# Patient Record
Sex: Male | Born: 1980
Health system: Southern US, Community
[De-identification: ages and names within clinical notes are randomized; demographics above are authoritative.]

## PROBLEM LIST (undated history)

## (undated) DIAGNOSIS — E232 Diabetes insipidus: Secondary | ICD-10-CM

## (undated) DIAGNOSIS — E249 Cushing's syndrome, unspecified: Secondary | ICD-10-CM

## (undated) HISTORY — PX: OTHER SURGICAL HISTORY: SHX169

## (undated) HISTORY — DX: Diabetes insipidus: E23.2

---
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
## 2003-09-03 ENCOUNTER — Ambulatory Visit (HOSPITAL_COMMUNITY): Admission: RE | Admit: 2003-09-03 | Discharge: 2003-09-03 | Payer: Self-pay | Admitting: Endocrinology

## 2004-05-19 ENCOUNTER — Ambulatory Visit: Payer: Self-pay | Admitting: Family Medicine

## 2004-07-29 ENCOUNTER — Ambulatory Visit: Payer: Self-pay | Admitting: Family Medicine

## 2013-10-03 HISTORY — PX: OTHER SURGICAL HISTORY: SHX169

## 2014-01-28 ENCOUNTER — Ambulatory Visit (INDEPENDENT_AMBULATORY_CARE_PROVIDER_SITE_OTHER): Payer: BC Managed Care – PPO | Admitting: Cardiovascular Disease

## 2014-01-28 ENCOUNTER — Telehealth: Payer: Self-pay | Admitting: *Deleted

## 2014-01-28 ENCOUNTER — Encounter: Payer: Self-pay | Admitting: *Deleted

## 2014-01-28 ENCOUNTER — Encounter: Payer: Self-pay | Admitting: Cardiovascular Disease

## 2014-01-28 ENCOUNTER — Other Ambulatory Visit: Payer: Self-pay | Admitting: *Deleted

## 2014-01-28 VITALS — BP 132/80 | HR 53 | Ht 69.0 in | Wt 158.0 lb

## 2014-01-28 DIAGNOSIS — Z0000 Encounter for general adult medical examination without abnormal findings: Secondary | ICD-10-CM

## 2014-01-28 DIAGNOSIS — R079 Chest pain, unspecified: Secondary | ICD-10-CM

## 2014-01-28 DIAGNOSIS — Z9889 Other specified postprocedural states: Secondary | ICD-10-CM

## 2014-01-28 DIAGNOSIS — Z8249 Family history of ischemic heart disease and other diseases of the circulatory system: Secondary | ICD-10-CM

## 2014-01-28 DIAGNOSIS — R490 Dysphonia: Secondary | ICD-10-CM

## 2014-01-28 NOTE — Telephone Encounter (Signed)
LEFT   3  VOICE MAILS OVER  THE LAST   3  DAYS  TO FIND  OUT  SOME HISTORY ON PT  HAS NOT  RETURNED ANY CALLS   LEFT MESSAGE FOR  PT  TO  COME  EARLY  TODAY   SO   CAN REVIEW  WITH PT .Benjamin Huynh

## 2014-01-28 NOTE — Patient Instructions (Signed)
Your physician wants you to follow-up in:  YEAR   WITH   DR Haywood Filler will receive a reminder letter in the mail two months in advance. If you don't receive a letter, please call our office to schedule the follow-up appointment. Your physician recommends that you continue on your current medications as directed. Please refer to the Current Medication list given to you today. Your physician has requested that you have an exercise tolerance test. For further information please visit [email or web address] Please also follow instruction sheet, as given.  Your physician recommends that you return for lab work in:  FASTING LIPOMED CA  SCORE   $150.00  OUT OF POCKET

## 2014-01-28 NOTE — Assessment & Plan Note (Signed)
Multiple male family members with MI in 58 90's  Will check lipomed profile.  Also recommended coronary calcium score

## 2014-01-28 NOTE — Assessment & Plan Note (Signed)
Atypical normal ECG  F/U ETT  

## 2014-01-28 NOTE — Progress Notes (Signed)
Patient ID: Benjamin Huynh, male   DOB: 26-Jun-1980, 33 y.o.   MRN: [id number]   33 yo referred for chest pain.  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  Patient has had some unusual medical issues at a young age.  Post pituitary surgery for cushings.  Sees Balin regularly Not on any replacement.  Short period of diabetes insipidus.  Also has had vocal cord papilloma's Recent surgery at [facility] Voice still hoarse.  Since surgery has had some straining to talk with resultant neck and chest pain.  Bad episode a few weeks ago after talking for a prolonged period.  Pain L chest crampy radiating to arm Lasted over an hour.  He does exercise without chest pain in general .  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   .     ROS: Denies fever, malais, weight loss, blurry vision, decreased visual acuity, cough, sputum, SOB, hemoptysis, pleuritic pain, palpitaitons, heartburn, abdominal pain, melena, lower extremity edema, claudication, or rash.  All other systems reviewed and negative   General: Affect appropriate Healthy:  appears stated age HEENT: normal Neck supple with no adenopathy JVP normal no bruits no thyromegaly Lungs clear with no wheezing and good diaphragmatic motion Heart:  S1/S2 no murmur,rub, gallop or click PMI normal Abdomen: benighn, BS positve, no tenderness, no AAA no bruit.  No HSM or HJR Distal pulses intact with no bruits No edema Neuro non-focal Skin warm and dry No muscular weakness  Medications Current Outpatient Prescriptions  Medication Sig Dispense Refill  . Multiple Vitamin (MULTIVITAMIN WITH MINERALS) TABS tablet Take 1 tablet by mouth daily.       No current facility-administered medications for this visit.    Allergies Prilosec  Family History: Family History  Problem Relation Age of Onset  . Melanoma Mother   . Melanoma Father   . Heart attack Father   . Heart disease Father     Social History: History   Social History    . Marital Status: Single    Spouse Name: N/A    Number of Children: N/A  . Years of Education: N/A   Occupational History  . Not on file.   Social History Main Topics  . Smoking status: Former Smoker    Types: Cigars  . Smokeless tobacco: Never Used  . Alcohol Use: Not on file  . Drug Use: Not on file  . Sexual Activity: Not on file   Other Topics Concern  . Not on file   Social History Narrative  . No narrative on file    Electrocardiogram:  SR rate 53 normal   Assessment and Plan

## 2014-01-28 NOTE — Assessment & Plan Note (Signed)
F/U Babtist  Slow to improve no evidence of malignancy per patient

## 2014-01-28 NOTE — Assessment & Plan Note (Signed)
No physical signs of cushings and f/u Baline for labs and surveillance for DI

## 2014-03-02 ENCOUNTER — Other Ambulatory Visit: Payer: BC Managed Care – PPO | Admitting: *Deleted

## 2014-03-02 ENCOUNTER — Ambulatory Visit (INDEPENDENT_AMBULATORY_CARE_PROVIDER_SITE_OTHER): Payer: BC Managed Care – PPO | Admitting: Physician Assistant

## 2014-03-02 ENCOUNTER — Ambulatory Visit (INDEPENDENT_AMBULATORY_CARE_PROVIDER_SITE_OTHER)
Admission: RE | Admit: 2014-03-02 | Discharge: 2014-03-02 | Disposition: A | Discharge status: Home / Self Care | Payer: Self-pay | Source: Ambulatory Visit | Attending: Cardiovascular Disease | Admitting: Cardiovascular Disease

## 2014-03-02 DIAGNOSIS — R079 Chest pain, unspecified: Secondary | ICD-10-CM

## 2014-03-02 DIAGNOSIS — Z0000 Encounter for general adult medical examination without abnormal findings: Secondary | ICD-10-CM

## 2014-03-02 NOTE — Addendum Note (Signed)
Addended by: Tonita Phoenix on: 03/02/2014 09:59 AM   Modules accepted: Orders

## 2014-03-02 NOTE — Progress Notes (Deleted)
Exercise Treadmill Test  Pre-Exercise Testing Evaluation Rhythm: sinus bradycardia  Rate: 52     Test  Exercise Tolerance Test Ordering MD: Charlton Haws, MD  Interpreting MD: Tereso Newcomer, PA-C  Unique Test No: 1  Treadmill:  1  Indication for ETT: chest pain - rule out ischemia  Contraindication to ETT: No   Stress Modality: exercise - treadmill  Cardiac Imaging Performed: non   Protocol: standard Bruce - maximal  Max BP:  176/83  Max MPHR (bpm):  190 85% MPR (bpm):  162  MPHR obtained (bpm):  171 % MPHR obtained:  90  Reached 85% MPHR (min:sec):  12:36 Total Exercise Time (min-sec):  14:00  Workload in METS:  17.2 Borg Scale: 13  Reason ETT Terminated:  desired heart rate attained    ST Segment Analysis At Rest: normal ST segments - no evidence of significant ST depression With Exercise: non-specific ST changes  Other Information Arrhythmia:  No Angina during ETT:  absent (0) Quality of ETT:  diagnostic  ETT Interpretation:  normal - no evidence of ischemia by ST analysis  Comments: Excellent exercise capacity. No chest pain. Normal BP response to exercise. Insignificant up-sloping ST depression. No changes to suggest ischemia.   Recommendations: FU with Dr. Charlton Haws as planned. Signed,  Tereso Newcomer, PA-C   03/02/2014 9:52 AM

## 2014-03-02 NOTE — Progress Notes (Signed)
Exercise Treadmill Test  Pre-Exercise Testing Evaluation Rhythm: sinus bradycardia  Rate: 52     Test  Exercise Tolerance Test Ordering MD: Peter Nishan, MD  Interpreting MD: Abdoulaye Drum, PA-C  Unique Test No: 1  Treadmill:  1  Indication for ETT: chest pain - rule out ischemia  Contraindication to ETT: No   Stress Modality: exercise - treadmill  Cardiac Imaging Performed: non   Protocol: standard Bruce - maximal  Max BP:  176/83  Max MPHR (bpm):  190 85% MPR (bpm):  162  MPHR obtained (bpm):  171 % MPHR obtained:  90  Reached 85% MPHR (min:sec):  12:36 Total Exercise Time (min-sec):  14:00  Workload in METS:  17.2 Borg Scale: 13  Reason ETT Terminated:  desired heart rate attained    ST Segment Analysis At Rest: normal ST segments - no evidence of significant ST depression With Exercise: non-specific ST changes  Other Information Arrhythmia:  No Angina during ETT:  absent (0) Quality of ETT:  diagnostic  ETT Interpretation:  normal - no evidence of ischemia by ST analysis  Comments: Excellent exercise capacity. No chest pain. Normal BP response to exercise. Insignificant up-sloping ST depression. No changes to suggest ischemia.   Recommendations: FU with Dr. Peter Nishan as planned. Signed,  Eniyah Eastmond, PA-C   03/02/2014 9:52 AM     

## 2014-03-04 LAB — NMR LIPOPROFILE WITH LIPIDS
Cholesterol, Total: 250 mg/dL — ABNORMAL HIGH (ref 100–199)
HDL PARTICLE NUMBER: 33.1 umol/L (ref >=30.5)
HDL Size: 9 nm — ABNORMAL LOW (ref >=9.2)
HDL-C: 56 mg/dL (ref >39)
LDL CALC: 175 mg/dL — AB (ref 0–99)
LDL PARTICLE NUMBER: 1868 nmol/L — AB (ref <1000)
LDL Size: 20.7 nm (ref >=20.8)
LP-IR SCORE: 39 (ref <=45)
Large HDL-P: 6.7 umol/L (ref >=4.8)
Large VLDL-P: 1.8 nmol/L (ref <=2.7)
Small LDL Particle Number: 641 nmol/L — ABNORMAL HIGH (ref <=527)
Triglycerides: 94 mg/dL (ref 0–149)
VLDL Size: 38.4 nm (ref <=46.6)

## 2014-03-05 NOTE — Progress Notes (Signed)
PT AWARE OF  CA SCORE  AND  GXT  RESULTS

## 2014-06-01 ENCOUNTER — Other Ambulatory Visit: Payer: Self-pay | Admitting: Urology

## 2014-06-01 DIAGNOSIS — N5082 Scrotal pain: Secondary | ICD-10-CM

## 2014-06-02 ENCOUNTER — Ambulatory Visit
Admission: RE | Admit: 2014-06-02 | Discharge: 2014-06-02 | Disposition: A | Discharge status: Home / Self Care | Payer: BC Managed Care – PPO | Source: Ambulatory Visit | Attending: Urology | Admitting: Urology

## 2014-06-02 DIAGNOSIS — N5082 Scrotal pain: Secondary | ICD-10-CM

## 2015-09-01 ENCOUNTER — Emergency Department (HOSPITAL_COMMUNITY)
Admission: EM | Admit: 2015-09-01 | Discharge: 2015-09-01 | Disposition: A | Discharge status: Home / Self Care | Payer: BLUE CROSS/BLUE SHIELD | Attending: Emergency Medicine | Admitting: Emergency Medicine

## 2015-09-01 ENCOUNTER — Encounter (HOSPITAL_COMMUNITY): Payer: Self-pay | Admitting: Emergency Medicine

## 2015-09-01 DIAGNOSIS — R197 Diarrhea, unspecified: Secondary | ICD-10-CM | POA: Insufficient documentation

## 2015-09-01 DIAGNOSIS — Z87891 Personal history of nicotine dependence: Secondary | ICD-10-CM | POA: Diagnosis not present

## 2015-09-01 DIAGNOSIS — R112 Nausea with vomiting, unspecified: Secondary | ICD-10-CM | POA: Diagnosis not present

## 2015-09-01 DIAGNOSIS — E860 Dehydration: Secondary | ICD-10-CM | POA: Insufficient documentation

## 2015-09-01 DIAGNOSIS — R1033 Periumbilical pain: Secondary | ICD-10-CM | POA: Diagnosis not present

## 2015-09-01 DIAGNOSIS — R509 Fever, unspecified: Secondary | ICD-10-CM | POA: Insufficient documentation

## 2015-09-01 DIAGNOSIS — M791 Myalgia: Secondary | ICD-10-CM | POA: Insufficient documentation

## 2015-09-01 LAB — COMPREHENSIVE METABOLIC PANEL
ALBUMIN: 5.1 g/dL — AB (ref 3.5–5.0)
ALK PHOS: 80 U/L (ref 38–126)
ALT: 23 U/L (ref 17–63)
AST: 26 U/L (ref 15–41)
Anion gap: 16 — ABNORMAL HIGH (ref 5–15)
BUN: 14 mg/dL (ref 6–20)
CALCIUM: 10.5 mg/dL — AB (ref 8.9–10.3)
CO2: 19 mmol/L — AB (ref 22–32)
Chloride: 106 mmol/L (ref 101–111)
Creatinine, Ser: 1.45 mg/dL — ABNORMAL HIGH (ref 0.61–1.24)
GFR calc Af Amer: >60 mL/min (ref >60)
GFR calc non Af Amer: >60 mL/min (ref >60)
GLUCOSE: 129 mg/dL — AB (ref 65–99)
Potassium: 4 mmol/L (ref 3.5–5.1)
SODIUM: 141 mmol/L (ref 135–145)
Total Bilirubin: 1.2 mg/dL (ref 0.3–1.2)
Total Protein: 7.9 g/dL (ref 6.5–8.1)

## 2015-09-01 LAB — I-STAT CHEM 8, ED
BUN: 19 mg/dL (ref 6–20)
Calcium, Ion: 1.13 mmol/L (ref 1.12–1.23)
Chloride: 109 mmol/L (ref 101–111)
Creatinine, Ser: 1.1 mg/dL (ref 0.61–1.24)
GLUCOSE: 110 mg/dL — AB (ref 65–99)
HCT: 50 % (ref 39.0–52.0)
HEMOGLOBIN: 17 g/dL (ref 13.0–17.0)
POTASSIUM: 4.8 mmol/L (ref 3.5–5.1)
SODIUM: 141 mmol/L (ref 135–145)
TCO2: 20 mmol/L (ref 0–100)

## 2015-09-01 LAB — CBC
HCT: 51.3 % (ref 39.0–52.0)
Hemoglobin: 18.3 g/dL — ABNORMAL HIGH (ref 13.0–17.0)
MCH: 30.7 pg (ref 26.0–34.0)
MCHC: 35.7 g/dL (ref 30.0–36.0)
MCV: 86.1 fL (ref 78.0–100.0)
Platelets: 266 10*3/uL (ref 150–400)
RBC: 5.96 MIL/uL — ABNORMAL HIGH (ref 4.22–5.81)
RDW: 12.4 % (ref 11.5–15.5)
WBC: 12.4 10*3/uL — ABNORMAL HIGH (ref 4.0–10.5)

## 2015-09-01 LAB — URINE MICROSCOPIC-ADD ON

## 2015-09-01 LAB — URINALYSIS, ROUTINE W REFLEX MICROSCOPIC
Glucose, UA: NEGATIVE mg/dL
HGB URINE DIPSTICK: NEGATIVE
KETONES UR: 15 mg/dL — AB
Nitrite: POSITIVE — AB
PROTEIN: 100 mg/dL — AB
Specific Gravity, Urine: 1.029 (ref 1.005–1.030)
pH: 5 (ref 5.0–8.0)

## 2015-09-01 LAB — I-STAT CG4 LACTIC ACID, ED
Lactic Acid, Venous: 3.53 mmol/L (ref 0.5–2.0)
Lactic Acid, Venous: 1.28 mmol/L (ref 0.5–2.0)

## 2015-09-01 LAB — I-STAT TROPONIN, ED: Troponin i, poc: 0 ng/mL (ref 0.00–0.08)

## 2015-09-01 LAB — LIPASE, BLOOD: Lipase: 23 U/L (ref 11–51)

## 2015-09-01 LAB — MAGNESIUM: Magnesium: 2.2 mg/dL (ref 1.7–2.4)

## 2015-09-01 LAB — CBG MONITORING, ED: Glucose-Capillary: 118 mg/dL — ABNORMAL HIGH (ref 65–99)

## 2015-09-01 MED ORDER — SODIUM CHLORIDE 0.9 % IV BOLUS (SEPSIS)
1000.0000 mL | Freq: Once | INTRAVENOUS | Status: AC
  Administered 2015-09-01: 1000 mL via INTRAVENOUS

## 2015-09-01 MED ORDER — ONDANSETRON 4 MG PO TBDP: ORAL_TABLET | ORAL | Status: DC

## 2015-09-01 MED ORDER — ONDANSETRON HCL 4 MG/2ML IJ SOLN
4.0000 mg | Freq: Once | INTRAMUSCULAR | Status: AC
  Administered 2015-09-01: 4 mg via INTRAVENOUS
  Filled 2015-09-01: qty 2

## 2015-09-01 MED ORDER — FENTANYL CITRATE (PF) 100 MCG/2ML IJ SOLN
50.0000 ug | Freq: Once | INTRAMUSCULAR | Status: DC
  Filled 2015-09-01: qty 2

## 2015-09-01 NOTE — ED Notes (Signed)
Temp unattainable in triage. Patient was eating ice cubes on arrival.

## 2015-09-01 NOTE — ED Notes (Signed)
Pt requesting to attempt fluids and food PO; pt given ginger ale and saltine crackers at this time

## 2015-09-01 NOTE — ED Notes (Signed)
Pt ambulatory to restroom with steady gait.

## 2015-09-01 NOTE — ED Provider Notes (Signed)
CSN: [id number]     Arrival date & time 09/01/15  0234 History   By signing my name below, I, Budd PalmerVanessa Prueter, attest that this documentation has been prepared under the direction and in the presence of Devoria AlbeIva Kaidan Harpster, MD at 0344 AM .  Electronically Signed: Budd PalmerVanessa Prueter, ED Scribe. 09/01/2015. 3:56 AM.      Chief Complaint  Patient presents with  . Emesis  . Diarrhea   The history is provided by the patient and the spouse. No language interpreter was used.   HPI Comments: Benjamin Huynh is a 35 y.o. male former smoker with a PMHx of diabetes insipidus who presents to the Emergency Department complaining of emesis (25 episodes) and watery diarrhea about the same onset 9 hours ago. Pt states that the last few vomiting episodes had bloody "chunks" in them. He reports associated periumbilical pain, nausea, subjective fever, generalized myalgias, and dark urine. He notes he called his endocrinologist Dr Lurene ShadowBallen, who recommended pt be seen in the ED. He is not on any medications, though he used to take DDAVP in the past. He notes Dr Lurene ShadowBallen recommended he take medication, but his other physicians did not see this as necessary. He states he has not taken any medication for his condition for over 6 years. He reports drinking occasionally. He does not recall whether he was ever given steroids for his diabetes insipidis. Per wife, pt's daughter is recovering from a stomach virus with similar symptoms. She notes pt had brain surgery for Cushing syndrome when the diabetes insipidus was discovered.   PCP none Endocrinology Dr Lurene ShadowBallen  Past Medical History  Diagnosis Date  . Diabetes insipidus (HCC)     HX OF   Past Surgical History  Procedure Laterality Date  . Vocal chord   spots  removed   10-2013  . Pitituary  sx   1998  AND  2005    CUSHING  SYNDROME   Family History  Problem Relation Age of Onset  . Melanoma Mother   . Melanoma Father   . Heart attack Father   . Heart disease Father    Social  History  Substance Use Topics  . Smoking status: Former Smoker    Types: Cigars  . Smokeless tobacco: Never Used  . Alcohol Use: None  employed Lives with spouse  Review of Systems  Constitutional: Positive for fever (subjective).  Gastrointestinal: Positive for nausea, vomiting, abdominal pain and diarrhea.  Musculoskeletal: Positive for myalgias.  All other systems reviewed and are negative.   Allergies  Prilosec  Home Medications   Prior to Admission medications   Medication Sig Start Date End Date Taking? Authorizing Provider  ondansetron (ZOFRAN ODT) 4 MG disintegrating tablet Dissolve on tongue 1 or 2 q8h prn nausea or vomiting 09/01/15   Devoria AlbeIva Mico Spark, MD   BP 134/94 mmHg  Pulse 124  Resp 20  Ht 5\' 9"  (1.753 m)  Wt 165 lb (74.844 kg)  BMI 24.36 kg/m2  SpO2 99%  Vital signs normal except for tachycardia  Physical Exam  Constitutional: He is oriented to person, place, and time. He appears well-developed and well-nourished.  Non-toxic appearance. He does not appear ill. He appears distressed.  Pt appears uncomfortable and is constantly moving his legs, he was laying on his abdomen with his head hanging off the foot of the stretcher when I first entered the room.   HENT:  Head: Normocephalic and atraumatic.  Right Ear: External ear normal.  Left Ear: External ear normal.  Nose: Nose normal. No mucosal edema or rhinorrhea.  Mouth/Throat: Mucous membranes are normal. No dental abscesses or uvula swelling.  Dry mucous membranes  Eyes: Conjunctivae and EOM are normal. Pupils are equal, round, and reactive to light.  Neck: Normal range of motion and full passive range of motion without pain. Neck supple.  Cardiovascular: Normal rate, regular rhythm and normal heart sounds.  Exam reveals no gallop and no friction rub.   No murmur heard. Pulmonary/Chest: Effort normal and breath sounds normal. No respiratory distress. He has no wheezes. He has no rhonchi. He has no rales. He  exhibits no tenderness and no crepitus.  Abdominal: Soft. Normal appearance and bowel sounds are normal. He exhibits no distension. There is tenderness (periumbilical). There is no rebound and no guarding.  Musculoskeletal: Normal range of motion. He exhibits no edema or tenderness.  Moves all extremities well.   Neurological: He is alert and oriented to person, place, and time. He has normal strength. No cranial nerve deficit.  Skin: Skin is warm, dry and intact. No rash noted. No erythema. No pallor.  Psychiatric: He has a normal mood and affect. His speech is normal and behavior is normal. His mood appears not anxious.  Nursing note and vitals reviewed.   ED Course  Procedures   Medications  fentaNYL (SUBLIMAZE) injection 50 mcg (0 mcg Intravenous Hold 09/01/15 0402)  sodium chloride 0.9 % bolus 1,000 mL (1,000 mLs Intravenous New Bag/Given 09/01/15 0711)  sodium chloride 0.9 % bolus 1,000 mL (0 mLs Intravenous Stopped 09/01/15 0708)  ondansetron (ZOFRAN) injection 4 mg (4 mg Intravenous Given 09/01/15 0402)  sodium chloride 0.9 % bolus 1,000 mL (0 mLs Intravenous Stopped 09/01/15 0708)  sodium chloride 0.9 % bolus 1,000 mL (1,000 mLs Intravenous New Bag/Given 09/01/15 0712)  ondansetron (ZOFRAN) injection 4 mg (4 mg Intravenous Given 09/01/15 0554)    DIAGNOSTIC STUDIES: Oxygen Saturation is 99% on RA, normal by my interpretation.    COORDINATION OF CARE: 3:51 AM - Discussed plans to order IV fluids and anti-nausea medication. Pt advised of plan for treatment and pt agrees.   05:30 AM recheck patient is now sitting on the bed in their correct position and having good eye contact and will obviously looks like he feels better. He states he still having some nausea. He has not had urinary output yet. His test results. Due to his metabolic acidosis and lactic acidosis repeat blood work will be done.  Recheck at 7 AM she has finished his first 2 L IV fluids, he has had minimal dark urine  output. He is going to get 2 more liters. Patient is feeling much improved. He looks much improved and is smiling.  Results for orders placed or performed during the hospital encounter of 09/01/15  Lipase, blood  Result Value Ref Range   Lipase 23 11 - 51 U/L  Comprehensive metabolic panel  Result Value Ref Range   Sodium 141 135 - 145 mmol/L   Potassium 4.0 3.5 - 5.1 mmol/L   Chloride 106 101 - 111 mmol/L   CO2 19 (L) 22 - 32 mmol/L   Glucose, Bld 129 (H) 65 - 99 mg/dL   BUN 14 6 - 20 mg/dL   Creatinine, Ser 1.61 (H) 0.61 - 1.24 mg/dL   Calcium 09.6 (H) 8.9 - 10.3 mg/dL   Total Protein 7.9 6.5 - 8.1 g/dL   Albumin 5.1 (H) 3.5 - 5.0 g/dL   AST 26 15 - 41 U/L   ALT 23  17 - 63 U/L   Alkaline Phosphatase 80 38 - 126 U/L   Total Bilirubin 1.2 0.3 - 1.2 mg/dL   GFR calc non Af Amer >60 >60 mL/min   GFR calc Af Amer >60 >60 mL/min   Anion gap 16 (H) 5 - 15  CBC  Result Value Ref Range   WBC 12.4 (H) 4.0 - 10.5 K/uL   RBC 5.96 (H) 4.22 - 5.81 MIL/uL   Hemoglobin 18.3 (H) 13.0 - 17.0 g/dL   HCT 16.1 09.6 - 04.5 %   MCV 86.1 78.0 - 100.0 fL   MCH 30.7 26.0 - 34.0 pg   MCHC 35.7 30.0 - 36.0 g/dL   RDW 40.9 81.1 - 91.4 %   Platelets 266 150 - 400 K/uL  Urinalysis, Routine w reflex microscopic (not at [facility])  Result Value Ref Range   Color, Urine ORANGE (A) YELLOW   APPearance TURBID (A) CLEAR   Specific Gravity, Urine 1.029 1.005 - 1.030   pH 5.0 5.0 - 8.0   Glucose, UA NEGATIVE NEGATIVE mg/dL   Hgb urine dipstick NEGATIVE NEGATIVE   Bilirubin Urine LARGE (A) NEGATIVE   Ketones, ur 15 (A) NEGATIVE mg/dL   Protein, ur 782 (A) NEGATIVE mg/dL   Nitrite POSITIVE (A) NEGATIVE   Leukocytes, UA SMALL (A) NEGATIVE  Urine microscopic-add on  Result Value Ref Range   Squamous Epithelial / LPF 0-5 (A) NONE SEEN   WBC, UA 6-30 0 - 5 WBC/hpf   RBC / HPF 0-5 0 - 5 RBC/hpf   Bacteria, UA MANY (A) NONE SEEN   Casts HYALINE CASTS (A) NEGATIVE   Crystals CA OXALATE CRYSTALS (A) NEGATIVE   Magnesium  Result Value Ref Range   Magnesium 2.2 1.7 - 2.4 mg/dL  CBG monitoring, ED  Result Value Ref Range   Glucose-Capillary 118 (H) 65 - 99 mg/dL  I-Stat CG4 Lactic Acid, ED  Result Value Ref Range   Lactic Acid, Venous 3.53 (HH) 0.5 - 2.0 mmol/L   Comment NOTIFIED PHYSICIAN   I-stat Chem 8, ED  Result Value Ref Range   Sodium 141 135 - 145 mmol/L   Potassium 4.8 3.5 - 5.1 mmol/L   Chloride 109 101 - 111 mmol/L   BUN 19 6 - 20 mg/dL   Creatinine, Ser 9.56 0.61 - 1.24 mg/dL   Glucose, Bld 213 (H) 65 - 99 mg/dL   Calcium, Ion 0.86 5.78 - 1.23 mmol/L   TCO2 20 0 - 100 mmol/L   Hemoglobin 17.0 13.0 - 17.0 g/dL   HCT 46.9 62.9 - 52.8 %  I-Stat CG4 Lactic Acid, ED  Result Value Ref Range   Lactic Acid, Venous 1.28 0.5 - 2.0 mmol/L   Laboratory interpretation all normal except concentrated hemoglobin consistent with dehydration, leukocytosis, lactic acidosis (I was not notified it was elevated), urinalysis suggestive of possible elevated LFTs with elevated bilirubin however his LFTs are normal. Mild renal insufficiency consistent with dehydration. Repeat lactic acidosis and repeat i-STAT 8 shows the metabolic acidosis has improved from an anion gap of 16 to normal at 12       MDM   Final diagnoses:  Nausea vomiting and diarrhea  Dehydration    New Prescriptions   ONDANSETRON (ZOFRAN ODT) 4 MG DISINTEGRATING TABLET    Dissolve on tongue 1 or 2 q8h prn nausea or vomiting    Plan discharge  Devoria Albe, MD, FACEP   I personally performed the services described in this documentation, which was scribed in my  presence. The recorded information has been reviewed and considered.  Devoria Albe, MD, Concha Pyo, MD 09/01/15 [phone]

## 2015-09-01 NOTE — ED Notes (Signed)
Patient arrives with complaint of vomiting and diarrhea since 2100 last night. Daughter recently with stomach virus and similar symptoms. History of diabetes insipidus. Lethargic vocalizing loudly in triage.

## 2015-09-01 NOTE — ED Notes (Signed)
Pt reports being able to keep fluids down at this time

## 2015-09-01 NOTE — Discharge Instructions (Signed)
Drink plenty of fluids (clear liquids) this morning then start the bland diet such as crackers, toast, jello, [employer] chicken noodle soup . Use the zofran for nausea or vomiting. Take imodium OTC for diarrhea. Avoid mild products until the diarrhea is gone. Avoid fried, spicy, or greasy foods for the next week. Recheck if you get worse again. Dehydration, Adult Dehydration is a condition in which you do not have enough fluid or water in your body. It happens when you take in less fluid than you lose. Vital organs such as the kidneys, brain, and heart cannot function without a proper amount of fluids. Any loss of fluids from the body can cause dehydration.  Dehydration can range from mild to severe. This condition should be treated right away to help prevent it from becoming severe. CAUSES  This condition may be caused by:  Vomiting.  Diarrhea.  Excessive sweating, such as when exercising in hot or humid weather.  Not drinking enough fluid during strenuous exercise or during an illness.  Excessive urine output.  Fever.  Certain medicines. RISK FACTORS This condition is more likely to develop in:  People who are taking certain medicines that cause the body to lose excess fluid (diuretics).   People who have a chronic illness, such as diabetes, that may increase urination.  Older adults.   People who live at high altitudes.   People who participate in endurance sports.  SYMPTOMS  Mild Dehydration  Thirst.  Dry lips.  Slightly dry mouth.  Dry, warm skin. Moderate Dehydration  Very dry mouth.   Muscle cramps.   Dark urine and decreased urine production.   Decreased tear production.   Headache.   Light-headedness, especially when you stand up from a sitting position.  Severe Dehydration  Changes in skin.   Cold and clammy skin.   Skin does not spring back quickly when lightly pinched and released.   Changes in body fluids.   Extreme thirst.    No tears.   Not able to sweat when body temperature is high, such as in hot weather.   Minimal urine production.   Changes in vital signs.   Rapid, weak pulse (more than 100 beats per minute when you are sitting still).   Rapid breathing.   Low blood pressure.   Other changes.   Sunken eyes.   Cold hands and feet.   Confusion.  Lethargy and difficulty being awakened.  Fainting (syncope).   Short-term weight loss.   Unconsciousness. DIAGNOSIS  This condition may be diagnosed based on your symptoms. You may also have tests to determine how severe your dehydration is. These tests may include:   Urine tests.   Blood tests.  TREATMENT  Treatment for this condition depends on the severity. Mild or moderate dehydration can often be treated at home. Treatment should be started right away. Do not wait until dehydration becomes severe. Severe dehydration needs to be treated at the hospital. Treatment for Mild Dehydration  Drinking plenty of water to replace the fluid you have lost.   Replacing minerals in your blood (electrolytes) that you may have lost.  Treatment for Moderate Dehydration  Consuming oral rehydration solution (ORS). Treatment for Severe Dehydration  Receiving fluid through an IV tube.   Receiving electrolyte solution through a feeding tube that is passed through your nose and into your stomach (nasogastric tube or NG tube).  Correcting any abnormalities in electrolytes. HOME CARE INSTRUCTIONS   Drink enough fluid to keep your urine clear or pale  yellow.   Drink water or fluid slowly by taking small sips. You can also try sucking on ice cubes.  Have food or beverages that contain electrolytes. Examples include bananas and sports drinks.  Take over-the-counter and prescription medicines only as told by your health care provider.   Prepare ORS according to the manufacturer's instructions. Take sips of ORS every 5 minutes  until your urine returns to normal.  If you have vomiting or diarrhea, continue to try to drink water, ORS, or both.   If you have diarrhea, avoid:   Beverages that contain caffeine.   Fruit juice.   Milk.   Carbonated soft drinks.  Do not take salt tablets. This can lead to the condition of having too much sodium in your body (hypernatremia).  SEEK MEDICAL CARE IF:  You cannot eat or drink without vomiting.  You have had moderate diarrhea during a period of more than 24 hours.  You have a fever. SEEK IMMEDIATE MEDICAL CARE IF:   You have extreme thirst.  You have severe diarrhea.  You have not urinated in 6-8 hours, or you have urinated only a small amount of very dark urine.  You have shriveled skin.  You are dizzy, confused, or both.   This information is not intended to replace advice given to you by your health care provider. Make sure you discuss any questions you have with your health care provider.   Document Released: 05/22/2005 Document Revised: 02/10/2015 Document Reviewed: 10/07/2014 Elsevier Interactive Patient Education 2016 Else[employer] Inc.  Diarrhea Diarrhea is frequent loose and watery bowel movements. It can cause you to feel weak and dehydrated. Dehydration can cause you to become tired and thirsty, have a dry mouth, and have decreased urination that often is dark yellow. Diarrhea is a sign of another problem, most often an infection that will not last long. In most cases, diarrhea typically lasts 2-3 days. However, it can last longer if it is a sign of something more serious. It is important to treat your diarrhea as directed by your caregiver to lessen or prevent future episodes of diarrhea. CAUSES  Some common causes include:  Gastrointestinal infections caused by viruses, bacteria, or parasites.  Food poisoning or food allergies.  Certain medicines, such as antibiotics, chemotherapy, and laxatives.  Artificial sweeteners and  fructose.  Digestive disorders. HOME CARE INSTRUCTIONS  Ensure adequate fluid intake (hydration): Have 1 cup (8 oz) of fluid for each diarrhea episode. Avoid fluids that contain simple sugars or sports drinks, fruit juices, whole milk products, and sodas. Your urine should be clear or pale yellow if you are drinking enough fluids. Hydrate with an oral rehydration solution that you can purchase at pharmacies, retail stores, and online. You can prepare an oral rehydration solution at home by mixing the following ingredients together:   - tsp table salt.   tsp baking soda.   tsp salt substitute containing potassium chloride.  1  tablespoons sugar.  1 L (34 oz) of water.  Certain foods and beverages may increase the speed at which food moves through the gastrointestinal (GI) tract. These foods and beverages should be avoided and include:  Caffeinated and alcoholic beverages.  High-fiber foods, such as raw fruits and vegetables, nuts, seeds, and whole grain breads and cereals.  Foods and beverages sweetened with sugar alcohols, such as xylitol, sorbitol, and mannitol.  Some foods may be well tolerated and may help thicken stool including:  Starchy foods, such as rice, toast, pasta, low-sugar cereal, oatmeal, grits,  baked potatoes, crackers, and bagels.  Bananas.  Applesauce.  Add probiotic-rich foods to help increase healthy bacteria in the GI tract, such as yogurt and fermented milk products.  Wash your hands well after each diarrhea episode.  Only take over-the-counter or prescription medicines as directed by your caregiver.  Take a warm bath to relieve any burning or pain from frequent diarrhea episodes. SEEK IMMEDIATE MEDICAL CARE IF:   You are unable to keep fluids down.  You have persistent vomiting.  You have blood in your stool, or your stools are black and tarry.  You do not urinate in 6-8 hours, or there is only a small amount of very dark urine.  You have  abdominal pain that increases or localizes.  You have weakness, dizziness, confusion, or light-headedness.  You have a severe headache.  Your diarrhea gets worse or does not get better.  You have a fever or persistent symptoms for more than 2-3 days.  You have a fever and your symptoms suddenly get worse. MAKE SURE YOU:   Understand these instructions.  Will watch your condition.  Will get help right away if you are not doing well or get worse.   This information is not intended to replace advice given to you by your health care provider. Make sure you discuss any questions you have with your health care provider.   Document Released: 05/12/2002 Document Revised: 06/12/2014 Document Reviewed: 01/28/2012 Elsevier Interactive Patient Education 2016 Elsevier Inc.  Nausea and Vomiting Nausea is a sick feeling that often comes before throwing up (vomiting). Vomiting is a reflex where stomach contents come out of your mouth. Vomiting can cause severe loss of body fluids (dehydration). Children and elderly adults can become dehydrated quickly, especially if they also have diarrhea. Nausea and vomiting are symptoms of a condition or disease. It is important to find the cause of your symptoms. CAUSES   Direct irritation of the stomach lining. This irritation can result from increased acid production (gastroesophageal reflux disease), infection, food poisoning, taking certain medicines (such as nonsteroidal anti-inflammatory drugs), alcohol use, or tobacco use.  Signals from the brain.These signals could be caused by a headache, heat exposure, an inner ear disturbance, increased pressure in the brain from injury, infection, a tumor, or a concussion, pain, emotional stimulus, or metabolic problems.  An obstruction in the gastrointestinal tract (bowel obstruction).  Illnesses such as diabetes, hepatitis, gallbladder problems, appendicitis, kidney problems, cancer, sepsis, atypical symptoms of  a heart attack, or eating disorders.  Medical treatments such as chemotherapy and radiation.  Receiving medicine that makes you sleep (general anesthetic) during surgery. DIAGNOSIS Your caregiver may ask for tests to be done if the problems do not improve after a few days. Tests may also be done if symptoms are severe or if the reason for the nausea and vomiting is not clear. Tests may include:  Urine tests.  Blood tests.  Stool tests.  Cultures (to look for evidence of infection).  X-rays or other imaging studies. Test results can help your caregiver make decisions about treatment or the need for additional tests. TREATMENT You need to stay well hydrated. Drink frequently but in small amounts.You may wish to drink water, sports drinks, clear broth, or eat frozen ice pops or gelatin dessert to help stay hydrated.When you eat, eating slowly may help prevent nausea.There are also some antinausea medicines that may help prevent nausea. HOME CARE INSTRUCTIONS   Take all medicine as directed by your caregiver.  If you do not  have an appetite, do not force yourself to eat. However, you must continue to drink fluids.  If you have an appetite, eat a normal diet unless your caregiver tells you differently.  Eat a variety of complex carbohydrates (rice, wheat, potatoes, bread), lean meats, yogurt, fruits, and vegetables.  Avoid high-fat foods because they are more difficult to digest.  Drink enough water and fluids to keep your urine clear or pale yellow.  If you are dehydrated, ask your caregiver for specific rehydration instructions. Signs of dehydration may include:  Severe thirst.  Dry lips and mouth.  Dizziness.  Dark urine.  Decreasing urine frequency and amount.  Confusion.  Rapid breathing or pulse. SEEK IMMEDIATE MEDICAL CARE IF:   You have blood or brown flecks (like coffee grounds) in your vomit.  You have black or bloody stools.  You have a severe headache  or stiff neck.  You are confused.  You have severe abdominal pain.  You have chest pain or trouble breathing.  You do not urinate at least once every 8 hours.  You develop cold or clammy skin.  You continue to vomit for longer than 24 to 48 hours.  You have a fever. MAKE SURE YOU:   Understand these instructions.  Will watch your condition.  Will get help right away if you are not doing well or get worse.   This information is not intended to replace advice given to you by your health care provider. Make sure you discuss any questions you have with your health care provider.   Document Released: 05/22/2005 Document Revised: 08/14/2011 Document Reviewed: 10/19/2010 Elsevier Interactive Patient Education [employer] Elsevier Inc. .

## 2017-12-01 ENCOUNTER — Emergency Department (HOSPITAL_COMMUNITY)
Admission: EM | Admit: 2017-12-01 | Discharge: 2017-12-01 | Disposition: A | Discharge status: Home / Self Care | Payer: BLUE CROSS/BLUE SHIELD | Attending: Emergency Medicine | Admitting: Emergency Medicine

## 2017-12-01 ENCOUNTER — Other Ambulatory Visit: Payer: Self-pay

## 2017-12-01 ENCOUNTER — Encounter (HOSPITAL_COMMUNITY): Payer: Self-pay

## 2017-12-01 DIAGNOSIS — W57XXXA Bitten or stung by nonvenomous insect and other nonvenomous arthropods, initial encounter: Secondary | ICD-10-CM

## 2017-12-01 DIAGNOSIS — F17290 Nicotine dependence, other tobacco product, uncomplicated: Secondary | ICD-10-CM | POA: Insufficient documentation

## 2017-12-01 DIAGNOSIS — T63441A Toxic effect of venom of bees, accidental (unintentional), initial encounter: Secondary | ICD-10-CM | POA: Diagnosis present

## 2017-12-01 HISTORY — DX: Cushing's syndrome, unspecified: E24.9

## 2017-12-01 MED ORDER — METHYLPREDNISOLONE SODIUM SUCC 125 MG IJ SOLR
125.0000 mg | Freq: Once | INTRAMUSCULAR | Status: AC
  Administered 2017-12-01: 125 mg via INTRAVENOUS
  Filled 2017-12-01: qty 2

## 2017-12-01 MED ORDER — DIPHENHYDRAMINE HCL 50 MG/ML IJ SOLN
25.0000 mg | Freq: Once | INTRAMUSCULAR | Status: AC
  Administered 2017-12-01: 25 mg via INTRAVENOUS
  Filled 2017-12-01: qty 1

## 2017-12-01 MED ORDER — EPINEPHRINE 0.3 MG/0.3ML IJ SOAJ: 0.3000 mg | Freq: Once | INTRAMUSCULAR | 0 refills | Status: AC

## 2017-12-01 MED ORDER — FAMOTIDINE IN NACL 20-0.9 MG/50ML-% IV SOLN
20.0000 mg | Freq: Once | INTRAVENOUS | Status: AC
  Administered 2017-12-01: 20 mg via INTRAVENOUS
  Filled 2017-12-01: qty 50

## 2017-12-01 MED ORDER — SODIUM CHLORIDE 0.9 % IV BOLUS
1000.0000 mL | Freq: Once | INTRAVENOUS | Status: AC
  Administered 2017-12-01: 1000 mL via INTRAVENOUS

## 2017-12-01 MED ORDER — PREDNISONE 10 MG (21) PO TBPK: ORAL_TABLET | Freq: Every day | ORAL | 0 refills | Status: DC

## 2017-12-01 NOTE — ED Triage Notes (Addendum)
Pt stung by "like 10 bees"; pt endorses allergic reaction in the past, but "it wasn't like this"  Pt sob, shaking; pt states "my face was numb, my lips were numb; I couldn't move my hands or feet"  Pt took 2 benadryl pta  VSS

## 2017-12-01 NOTE — ED Provider Notes (Signed)
[facility] EMERGENCY DEPARTMENT Provider Note   CSN: [id number] Arrival date & time: 12/01/17  1739     History   Chief Complaint Chief Complaint  Patient presents with  . Allergic Reaction    HPI [facility] Benjamin Huynh. is a 37 y.o. male here for evaluation of possible allergic reaction.  Patient was outside and was stung by approximately 10 bees.  He took 2 Benadryl PTA.  Initially had shortness of breath, this has resolved.  Currently is endorsing tingling all over his body including his face, feels like he cannot move his hands or feet.  Has local swelling to the areas where he was stung but denies rash.  He has no headache, nausea, vomiting, chest pain, shortness of breath, abdominal pain, diarrhea. Onset was sudden, improving.   HPI  Past Medical History:  Diagnosis Date  . Cushings syndrome (HCC)     There are no active problems to display for this patient.   ** The histories are not reviewed yet. Please review them in the "History" navigator section and refresh this SmartLink.      Home Medications    Prior to Admission medications   Medication Sig Start Date End Date Taking? Authorizing Provider  diphenhydrAMINE (BENADRYL) 25 mg capsule Take 50 mg by mouth every 6 (six) hours as needed for allergies.   Yes [provider]  EPINEPHrine 0.3 mg/0.3 mL IJ SOAJ injection Inject 0.3 mLs (0.3 mg total) into the muscle once for 1 dose. 12/01/17 12/01/17  Liberty Handy, PA-C  predniSONE (STERAPRED UNI-PAK 21 TAB) 10 MG (21) TBPK tablet Take by mouth daily. Take 3 tabs by mouth daily  for 2 days, then 2 tabs for 2 days, then 1 tabs for 2 days, then 1/5 tab for 2 days. 12/01/17   Liberty Handy, PA-C    Family History No family history on file.  Social History Social History   Tobacco Use  . Smoking status: Light Tobacco Smoker    Types: Cigars  Substance Use Topics  . Alcohol use: Yes    Alcohol/week: 6.0 oz    Types: 10 Cans of  beer per week  . Drug use: Not Currently     Allergies   Bee venom and Omeprazole   Review of Systems Review of Systems  Respiratory: Positive for shortness of breath (resolved).   Neurological:       Paresthesias   All other systems reviewed and are negative.    Physical Exam Updated Vital Signs BP 116/74   Pulse (!) 53   Resp 10   SpO2 95%   Physical Exam  Constitutional: He is oriented to person, place, and time. He appears well-developed and well-nourished. No distress.  Non toxic. Looks anxious.   HENT:  Head: Normocephalic and atraumatic.  Nose: Nose normal.  No facial, tongue, oropharyngeal, lip edema. Oropharynx and tonsils easily visualized. Uvula midline without edema. MMM. No trismus. No stridor.   Eyes: Pupils are equal, round, and reactive to light. Conjunctivae and EOM are normal.  Neck: Normal range of motion.  Cardiovascular: Normal rate, regular rhythm, normal heart sounds and intact distal pulses.  2+ DP and radial pulses bilaterally. No LE asymmetric edema.   Pulmonary/Chest: Effort normal and breath sounds normal.  No wheezing. Normal WOB. Speaking in full sentences.   Abdominal: Soft. Bowel sounds are normal. There is no tenderness.  No G/R/R. No suprapubic or CVA tenderness. Negative Murphy's and McBurney's  Musculoskeletal: Normal range of  motion.  Neurological: He is alert and oriented to person, place, and time.  Skin: Skin is warm and dry. Capillary refill takes less than 2 seconds.  One urticarial lesion to epigastrium approx 1x1 cm. No diffuse urticarial rash to face, thorax, upper or lower extremities.   Psychiatric: He has a normal mood and affect. His behavior is normal. Judgment and thought content normal.  Nursing note and vitals reviewed.    ED Treatments / Results  Labs (all labs ordered are listed, but only abnormal results are displayed) Labs Reviewed - No data to display  EKG None  Radiology No results  found.  Procedures Procedures (including critical care time)  Medications Ordered in ED Medications  sodium chloride 0.9 % bolus 1,000 mL (1,000 mLs Intravenous New Bag/Given 12/01/17 1819)  diphenhydrAMINE (BENADRYL) injection 25 mg (25 mg Intravenous Given 12/01/17 1825)  methylPREDNISolone sodium succinate (SOLU-MEDROL) 125 mg/2 mL injection 125 mg (125 mg Intravenous Given 12/01/17 1820)  famotidine (PEPCID) IVPB 20 mg premix (0 mg Intravenous Stopped 12/01/17 1901)     Initial Impression / Assessment and Plan / ED Course  I have reviewed the triage vital signs and the nursing notes.  Pertinent labs & imaging results that were available during my care of the patient were reviewed by me and considered in my medical decision making (see chart for details).  Clinical Course as of Dec 02 1938  Sat Dec 01, 2017  1930 Reevaluated patient.  He reports complete resolution of symptoms.  Vital signs within normal limits and stable.  Discussed plan to discharge and patient and family agreeable.   [CG]    Clinical Course User Index [CG] Liberty HandyGibbons, Zoua Caporaso J, PA-C   37 year old here after possible allergic reaction.  He was stung by multiple bees.  Initially, patient appears very anxious but overall nontoxic.  No tachycardia.  No signs of facial angioedema, wheezing.  He has no urticarial rash.  Minimal local inflammation to bee stings noted.  By the time he arrived to the ER his initial shortness of breath had resolved.  No signs of anaphylactic reaction.  Patient was given IV fluids, Pepcid, Benadryl, Solu-Medrol and his symptoms completely resolved.  Tolerating p.o.  Will discharge with short prednisone taper and EpiPen.  Discussed indications for EpiPen use.  Discussed return precautions.  Patient and wife are in agreement.  Final Clinical Impressions(s) / ED Diagnoses   Final diagnoses:  Insect bite of multiple sites with local reaction    ED Discharge Orders        Ordered    EPINEPHrine  0.3 mg/0.3 mL IJ SOAJ injection   Once     12/01/17 1931    predniSONE (STERAPRED UNI-PAK 21 TAB) 10 MG (21) TBPK tablet  Daily     12/01/17 1939       Jerrell MylarGibbons, Kein Carlberg J, PA-C 12/01/17 1940    Raeford RazorKohut, Stephen, MD 12/01/17 2355

## 2017-12-01 NOTE — Discharge Instructions (Addendum)
You were seen in the emergency department after a reaction to bee stings.  By the time you were in the ER your symptoms had started to improve.  You were given steroids, pepcid, Benadryl and fluids to help with the reaction.  Your symptoms completely resolved.   Typical, mild, allergic reactions include rash, itching, skin flushing, local inflammation.  More severe reactions include chest pain, shortness of breath, facial tongue or lip swelling, difficulty swallowing saliva, abdominal pain, vomiting diarrhea. Return if you develop symptoms of severe reaction. Use epipen for severe reactions.   Complete steroid taper, this helps prevent delayed allergic reaction or rebound allergic reaction.

## 2017-12-03 ENCOUNTER — Encounter (HOSPITAL_COMMUNITY): Payer: Self-pay | Admitting: Emergency Medicine

## 2018-03-04 ENCOUNTER — Ambulatory Visit: Payer: BLUE CROSS/BLUE SHIELD | Admitting: Neurology

## 2018-04-22 ENCOUNTER — Telehealth: Payer: Self-pay | Admitting: Neurology

## 2018-04-22 ENCOUNTER — Ambulatory Visit (INDEPENDENT_AMBULATORY_CARE_PROVIDER_SITE_OTHER): Payer: BLUE CROSS/BLUE SHIELD | Admitting: Neurology

## 2018-04-22 ENCOUNTER — Encounter: Payer: Self-pay | Admitting: Neurology

## 2018-04-22 VITALS — BP 125/77 | HR 56 | Ht 68.0 in | Wt 165.0 lb

## 2018-04-22 DIAGNOSIS — M5412 Radiculopathy, cervical region: Secondary | ICD-10-CM

## 2018-04-22 DIAGNOSIS — R29898 Other symptoms and signs involving the musculoskeletal system: Secondary | ICD-10-CM | POA: Diagnosis not present

## 2018-04-22 DIAGNOSIS — M4712 Other spondylosis with myelopathy, cervical region: Secondary | ICD-10-CM | POA: Diagnosis not present

## 2018-04-22 DIAGNOSIS — R200 Anesthesia of skin: Secondary | ICD-10-CM

## 2018-04-22 NOTE — Progress Notes (Signed)
[facility]    Provider:  Dr Lucia Gaskins Referring Provider: [name], DC Primary Care Physician:   [name], DC  CC:  Arm numbness  HPI:  [facility] Montez Hageman. is a 37 y.o. male here as requested by Dr. Manson Passey for neck and back pain.  Patient has a past medical history of pituitary adenoma, diabetes insipidus, Cushing syndrome in 1998 treated with surgery.  Other past medical history noted from review of chart includes in 2015 seen at [facility]: Laryngeal pharyngeal reflux, dysphonia, lesion of vocal cord, laryngeal papilloma, anterior laryngeal web. The spine feels tingly paraspinals left side (points to C7). Ongoing for  Months. He had numbness of both arms, from the shoulders to the hands, positional when he layed down, would happen more when working out. Recently he was digging in his yard and symptoms lasted 6 months, symptoms would be continuous and he had a spot in his back that felt tingly near his spine, position mad his hands tingle. Worse with laying down, he would wake up with the pain. His DO said he saw degenerative changes in the spine around the are of where it hurts. He also had mild weakness in the arms. Bilateral. No other associated symptoms such as dysphagia, dysarthria, vision changes but his legs felt numb too. He went to chiro for 6 months and conservative treatment. When he was young would notice sensations in the hands, numbness that would resolve.  Reviewed notes, labs and imaging from outside physicians, which showed:  Reviewed notes from [name] patient has been diagnosed with segmental and somatic dysfunctional of cervical region, cervical radiculopathy, segmental and somatic dysfunction of lumbar region.  His chief complaint is numbness and tingling in the hands onset 2014.  The patient is well-nourished and well-developed and in no acute distress.  The patient complained of neck pain with radiation into the arms bilaterally in the C6  dermatomes.  Radiculopathy is worse in the a.m. and often awakens him.  Also present after long drives.  Patient reports pain in the cervical spine.  A dull ache radiates along the paraspinal musculature from C2-C7.  Sharp ache tends to radiate into the trapezius muscles and the base of the skull.  Rotation of both right and left lateral flexion causes a sharp stabbing sensation.  Flexion and extension causes stiffness and a soreness that is occasionally sharp in nature.  Any sudden movement causes musculature increase in symptoms.  He also reports pain in the lumbar spine a dull ache radiates along the paraspinal musculature bilaterally from L2-S1.  Sharp ache tends to radiate into the sacroiliac joint.  Rotation in both right and left lateral flexion causes a sharp stabbing sensation.  Flexion and extension causes a stiffness and a soreness that is occasionally sharp in nature.  Any sudden movement causes an increase in symptoms.  He has been treated in chiropractic care twice weekly over 4 weeks and notices slight improvement after his adjustment but his symptoms returned soon after.  He was referred here for neurologic consultation..  Reviewed report of MRI in 2005.  Patient with Cushing's disease possible pituitary adenoma.  Patient had a history of pituitary adenoma in 1998 treated with surgery.  MRI of the brain and sella with and without contrast.  Normal MRI of the brain.  Apparent 4 mm hypoenhancing focus within the right side of the pituitary gland this could represent a pituitary microadenoma.  Review of Systems: Patient complains of symptoms per HPI as well  as the following symptoms: numbness. Pertinent negatives and positives per HPI. All others negative.   Social History   Socioeconomic History  . Marital status: Married    Spouse name: Not on file  . Number of children: 2  . Years of education: Not on file  . Highest education level: Master's degree (e.g., MA, MS, MEng, MEd, MSW, MBA)    Occupational History  . Not on file  Social Needs  . Financial resource strain: Not on file  . Food insecurity:    Worry: Not on file    Inability: Not on file  . Transportation needs:    Medical: Not on file    Non-medical: Not on file  Tobacco Use  . Smoking status: Light Tobacco Smoker    Types: Cigars  . Smokeless tobacco: Never Used  . Tobacco comment: very rare  Substance and Sexual Activity  . Alcohol use: Yes    Alcohol/week: 10.0 standard drinks    Types: 10 Cans of beer per week  . Drug use: Not Currently  . Sexual activity: Not on file  Lifestyle  . Physical activity:    Days per week: Not on file    Minutes per session: Not on file  . Stress: Not on file  Relationships  . Social connections:    Talks on phone: Not on file    Gets together: Not on file    Attends religious service: Not on file    Active member of club or organization: Not on file    Attends meetings of clubs or organizations: Not on file    Relationship status: Not on file  . Intimate partner violence:    Fear of current or ex partner: Not on file    Emotionally abused: Not on file    Physically abused: Not on file    Forced sexual activity: Not on file  Other Topics Concern  . Not on file  Social History Narrative   Lives at home with wife & kids   Right handed   Caffeine: about 4 cups daily         ** Merged History Encounter **        Family History  Problem Relation Age of Onset  . Melanoma Mother   . CAD Mother        stents  . Hodgkin's lymphoma Mother   . Melanoma Father   . Heart attack Father   . Heart disease Father   . Other Sister        meningitis  . Heart attack Paternal Grandmother   . Heart attack Paternal Grandfather   . Breast cancer Other        multiple family members    Past Medical History:  Diagnosis Date  . Cushings syndrome (HCC)   . Diabetes insipidus (HCC)    HX OF    Past Surgical History:  Procedure Laterality Date  . PITITUARY  SX    1998  AND  2005   CUSHING  SYNDROME  . polyp removal    . VOCAL CHORD   SPOTS  REMOVED   10-2013    No current outpatient medications on file.   No current facility-administered medications for this visit.     Allergies as of 04/22/2018 - Review Complete 04/22/2018  Allergen Reaction Noted  . Bee venom Anaphylaxis 12/01/2017  . Omeprazole Other (See Comments) 12/01/2017  . Prilosec [omeprazole] Other (See Comments) 01/28/2014    Vitals: BP 125/77 (BP Location: Left  Arm, Patient Position: Sitting)   Pulse (!) 56   Ht 5\' 8"  (1.727 m)   Wt 165 lb (74.8 kg)   BMI 25.09 kg/m  Last Weight:  Wt Readings from Last 1 Encounters:  04/22/18 165 lb (74.8 kg)   Last Height:   Ht Readings from Last 1 Encounters:  04/22/18 5\' 8"  (1.727 m)   Physical exam: Exam: Gen: NAD, conversant, well nourised,  well groomed                     CV: RRR, no MRG. No Carotid Bruits. No peripheral edema, warm, nontender Eyes: Conjunctivae clear without exudates or hemorrhage  Neuro: Detailed Neurologic Exam  Speech:    Speech is normal; fluent and spontaneous with normal comprehension.  Cognition:    The patient is oriented to person, place, and time;     recent and remote memory intact;     language fluent;     normal attention, concentration,     fund of knowledge Cranial Nerves:    The pupils are equal, round, and reactive to light. The fundi are normal and spontaneous venous pulsations are present. Visual fields are full to finger confrontation. Extraocular movements are intact. Trigeminal sensation is intact and the muscles of mastication are normal. The face is symmetric. The palate elevates in the midline. Hearing intact. Voice is normal. Shoulder shrug is normal. The tongue has normal motion without fasciculations.   Coordination:    Normal finger to nose and heel to shin. Normal rapid alternating movements.   Gait:    Heel-toe and tandem gait are normal.   Motor Observation:    No  asymmetry, no atrophy, and no involuntary movements noted. Tone:    Normal muscle tone.    Posture:    Posture is normal. normal erect    Strength: weaker hand grip left as compared ot the right.otherwise strength is V/V in the upper and lower limbs.      Sensation: intact to LT     Reflex Exam:  DTR's:    Deep tendon reflexes in the upper and lower extremities are brisker on the left bilaterally.   Toes:    The toes are equiv bilaterally.   Clonus:    Clonus is absent.       Assessment/Plan:  [age] year old with numbness ad tingling in both arms and in the legs, point tenderness of the spine and degenerative changes on XRay at the area of his pain, focal findings of brisk asymmetric reflexes on the left side, >6 months of conservative treatment and symptoms continue. Need MRI cervical spine to evaluate for cevical lesion/myelopathy given focal point tendernes c7/c8 and focal neurologic findings, numbness of both arms and sometimes legs.  Emg/ncs bilateral uppers MRI cervical spine  If no significant findings consider MRI of the brain  Orders Placed This Encounter  Procedures  . MR CERVICAL SPINE WO CONTRAST  . NCV with EMG(electromyography)    Cc:  Alan MulderBrown, Rodney, DC   Naomie DeanAntonia Alphonse Asbridge, MD  [facility] [address] [place], [place] [id number]  Phone [phone] Fax [phone]

## 2018-04-22 NOTE — Telephone Encounter (Signed)
lvm for pt to call back about scheduling mri  [employer] Auth: [id number] (exp. 04/22/18 to 05/21/18)

## 2018-04-22 NOTE — Telephone Encounter (Signed)
Patient returned my call [facility]

## 2018-04-22 NOTE — Patient Instructions (Signed)
MRI cervical spine Emg/ncs  Electromyoneurogram Electromyoneurogram is a test to check how well your muscles and nerves are working. This procedure includes the combined use of electromyogram (EMG) and nerve conduction study (NCS). EMG is used to look for muscular disorders. NCS, which is also called electroneurogram, measures how well your nerves are controlling your muscles. The procedures are usually performed together to check if your muscles and nerves are healthy. If the reaction to testing is abnormal, this can indicate disease or injury, such as peripheral nerve damage. Tell a health care provider about:  Any allergies you have.  All medicines you are taking, including vitamins, herbs, eye drops, creams, and over-the-counter medicines.  Any problems you or family members have had with anesthetic medicines.  Any blood disorders you have.  Any surgeries you have had.  Any medical conditions you have.  Any pacemaker you have. What are the risks? Generally, this is a safe procedure. However, problems may occur, including:  Infection where the electrodes were inserted.  Bleeding.  What happens before the procedure?  Ask your health care provider about: ? Changing or stopping your regular medicines. This is especially important if you are taking diabetes medicines or blood thinners. ? Taking medicines such as aspirin and ibuprofen. These medicines can thin your blood. Do not take these medicines before your procedure if your health care provider instructs you not to.  Your health care provider may ask you to avoid: ? Caffeine, such as coffee and tea. ? Nicotine. This includes cigarettes and anything with tobacco.  Do not use lotions or creams on the same day that you will be having the procedure. What happens during the procedure? For EMG:  Your health care provider will ask you to stay in a position so that he or she can access the muscle that will be studied. You may be  standing, sitting down, or lying down.  You may be given a medicine that numbs the area (local anesthetic).  A very thin needle that has an electrode on it will be inserted into your muscle.  Another small electrode will be placed on your skin near the muscle.  Your health care provider will ask you to continue to remain still.  The electrodes will send a signal that tells about the electrical activity of your muscles. You may see this on a monitor or hear it in the room.  After your muscles have been studied at rest, your health care provider will ask you to contract or flex your muscles. The electrodes will send a signal that tells about the electrical activity of your muscles.  Your health care provider will remove the electrodes and the electrode needles when the procedure is finished. The procedure may vary among health care providers and hospitals. For NCS:  An electrode that records your nerve activity (recording electrode) will be placed on your skin by the muscle that is being studied.  An electrode that is used as a reference (reference electrode) will be placed near the recording electrode.  A paste or gel will be applied to your skin between the recording electrode and the reference electrode.  Your nerve will be stimulated with a mild shock. Your health care provider will measure how much time it takes for your muscle to react.  Your health care provider will remove the electrodes and the gel when the procedure is finished. The procedure may vary among health care providers and hospitals. What happens after the procedure?  It is your  responsibility to obtain your test results. Ask your health care provider or the department performing the test when and how you will get your results.  Your health care provider may: ? Give you medicines for any pain. ? Monitor the insertion sites to make sure that they stop bleeding. This information is not intended to replace advice  given to you by your health care provider. Make sure you discuss any questions you have with your health care provider. Document Released: 09/22/2004 Document Revised: 10/28/2015 Document Reviewed: 07/13/2014 Elsevier Interactive Patient Education  2018 [employer].

## 2018-04-30 ENCOUNTER — Ambulatory Visit: Payer: BLUE CROSS/BLUE SHIELD

## 2018-04-30 DIAGNOSIS — M5412 Radiculopathy, cervical region: Secondary | ICD-10-CM | POA: Diagnosis not present

## 2018-04-30 DIAGNOSIS — R29898 Other symptoms and signs involving the musculoskeletal system: Secondary | ICD-10-CM | POA: Diagnosis not present

## 2018-04-30 DIAGNOSIS — R200 Anesthesia of skin: Secondary | ICD-10-CM

## 2018-04-30 DIAGNOSIS — G959 Disease of spinal cord, unspecified: Secondary | ICD-10-CM

## 2018-04-30 DIAGNOSIS — M4712 Other spondylosis with myelopathy, cervical region: Secondary | ICD-10-CM | POA: Diagnosis not present

## 2018-05-09 ENCOUNTER — Telehealth: Payer: Self-pay | Admitting: *Deleted

## 2018-05-09 NOTE — Telephone Encounter (Signed)
-----   Message from Anson FretAntonia B Ahern, MD sent at 05/01/2018  8:45 AM EST ----- This MRI of the cervical spine shows minimal degenerative changes at C3-C4 and C4-C5 that do not lead to foraminal narrowing or nerve root compression.  Other levels are normal.  The spinal cord appears normal. Does not appear his symptoms are due to problems in the cervical spine. We will see him for emg/ncs thanks

## 2018-05-09 NOTE — Telephone Encounter (Signed)
Called pt & LVM (ok per DPR) informing pt that MRI of his cervical spine shows minimal degenerative changes at C3-C4 and C4-C5 that do not lead to foraminal narrowing or nerve root compression.  Other levels are normal.  The spinal cord appears normal. Does not appear his symptoms are due to problems in the cervical spine. We will see him for emg/ncs on 05/23/18. Left office number and hours in case pt has any questions prior to EMG.

## 2018-05-23 ENCOUNTER — Encounter: Payer: BLUE CROSS/BLUE SHIELD | Admitting: Neurology

## 2019-03-20 ENCOUNTER — Other Ambulatory Visit: Payer: Self-pay

## 2019-03-20 DIAGNOSIS — Z20822 Contact with and (suspected) exposure to covid-19: Secondary | ICD-10-CM

## 2019-03-21 LAB — NOVEL CORONAVIRUS, NAA: SARS-CoV-2, NAA: NOT DETECTED

## 2019-03-31 ENCOUNTER — Other Ambulatory Visit: Payer: Self-pay

## 2019-03-31 DIAGNOSIS — Z20822 Contact with and (suspected) exposure to covid-19: Secondary | ICD-10-CM

## 2019-04-01 LAB — NOVEL CORONAVIRUS, NAA: SARS-CoV-2, NAA: NOT DETECTED

## 2019-05-22 DIAGNOSIS — M5411 Radiculopathy, occipito-atlanto-axial region: Secondary | ICD-10-CM | POA: Diagnosis not present

## 2019-05-22 DIAGNOSIS — M9903 Segmental and somatic dysfunction of lumbar region: Secondary | ICD-10-CM | POA: Diagnosis not present

## 2019-05-22 DIAGNOSIS — M9901 Segmental and somatic dysfunction of cervical region: Secondary | ICD-10-CM | POA: Diagnosis not present

## 2019-06-09 DIAGNOSIS — M9901 Segmental and somatic dysfunction of cervical region: Secondary | ICD-10-CM | POA: Diagnosis not present

## 2019-06-09 DIAGNOSIS — M9903 Segmental and somatic dysfunction of lumbar region: Secondary | ICD-10-CM | POA: Diagnosis not present

## 2019-06-09 DIAGNOSIS — M5411 Radiculopathy, occipito-atlanto-axial region: Secondary | ICD-10-CM | POA: Diagnosis not present

## 2019-06-23 DIAGNOSIS — M9901 Segmental and somatic dysfunction of cervical region: Secondary | ICD-10-CM | POA: Diagnosis not present

## 2019-06-23 DIAGNOSIS — M5411 Radiculopathy, occipito-atlanto-axial region: Secondary | ICD-10-CM | POA: Diagnosis not present

## 2019-06-23 DIAGNOSIS — M9903 Segmental and somatic dysfunction of lumbar region: Secondary | ICD-10-CM | POA: Diagnosis not present

## 2019-06-26 ENCOUNTER — Other Ambulatory Visit: Payer: Self-pay

## 2019-06-26 ENCOUNTER — Ambulatory Visit (INDEPENDENT_AMBULATORY_CARE_PROVIDER_SITE_OTHER): Payer: BC Managed Care – PPO | Admitting: Neurology

## 2019-06-26 DIAGNOSIS — R292 Abnormal reflex: Secondary | ICD-10-CM | POA: Diagnosis not present

## 2019-06-26 DIAGNOSIS — R29818 Other symptoms and signs involving the nervous system: Secondary | ICD-10-CM

## 2019-06-26 DIAGNOSIS — G5603 Carpal tunnel syndrome, bilateral upper limbs: Secondary | ICD-10-CM | POA: Diagnosis not present

## 2019-06-26 DIAGNOSIS — R200 Anesthesia of skin: Secondary | ICD-10-CM

## 2019-06-26 DIAGNOSIS — R202 Paresthesia of skin: Secondary | ICD-10-CM

## 2019-06-26 DIAGNOSIS — Z0289 Encounter for other administrative examinations: Secondary | ICD-10-CM

## 2019-06-26 NOTE — Progress Notes (Signed)
See procedure note.

## 2019-06-26 NOTE — Patient Instructions (Addendum)
MRI brain   Carpal Tunnel Syndrome  Carpal tunnel syndrome is a condition that causes pain in your hand and arm. The carpal tunnel is a narrow area located on the palm side of your wrist. Repeated wrist motion or certain diseases may cause swelling within the tunnel. This swelling pinches the main nerve in the wrist (median nerve). What are the causes? This condition may be caused by:  Repeated wrist motions.  Wrist injuries.  Arthritis.  A cyst or tumor in the carpal tunnel.  Fluid buildup during pregnancy. Sometimes the cause of this condition is not known. What increases the risk? The following factors may make you more likely to develop this condition:  Having a job, such as being a Haematologist, that requires you to repeatedly move your wrist in the same motion.  Being a woman.  Having certain conditions, such as: ? Diabetes. ? Obesity. ? An underactive thyroid (hypothyroidism). ? Kidney failure. What are the signs or symptoms? Symptoms of this condition include:  A tingling feeling in your fingers, especially in your thumb, index, and middle fingers.  Tingling or numbness in your hand.  An aching feeling in your entire arm, especially when your wrist and elbow are bent for a long time.  Wrist pain that goes up your arm to your shoulder.  Pain that goes down into your palm or fingers.  A weak feeling in your hands. You may have trouble grabbing and holding items. Your symptoms may feel worse during the night. How is this diagnosed? This condition is diagnosed with a medical history and physical exam. You may also have tests, including:  Electromyogram (EMG). This test measures electrical signals sent by your nerves into the muscles.  Nerve conduction study. This test measures how well electrical signals pass through your nerves.  Imaging tests, such as X-rays, ultrasound, and MRI. These tests check for possible causes of your condition. How is this  treated? This condition may be treated with:  Lifestyle changes. It is important to stop or change the activity that caused your condition.  Doing exercise and activities to strengthen your muscles and bones (physical therapy).  Learning how to use your hand again after diagnosis (occupational therapy).  Medicines for pain and inflammation. This may include medicine that is injected into your wrist.  A wrist splint.  Surgery. Follow these instructions at home: If you have a splint:  Wear the splint as told by your health care provider. Remove it only as told by your health care provider.  Loosen the splint if your fingers tingle, become numb, or turn cold and blue.  Keep the splint clean.  If the splint is not waterproof: ? Do not let it get wet. ? Cover it with a watertight covering when you take a bath or shower. Managing pain, stiffness, and swelling   If directed, put ice on the painful area: ? If you have a removable splint, remove it as told by your health care provider. ? Put ice in a plastic bag. ? Place a towel between your skin and the bag. ? Leave the ice on for 20 minutes, 2-3 times per day. General instructions  Take over-the-counter and prescription medicines only as told by your health care provider.  Rest your wrist from any activity that may be causing your pain. If your condition is work related, talk with your employer about changes that can be made, such as getting a wrist pad to use while typing.  Do  any exercises as told by your health care provider, physical therapist, or occupational therapist.  Keep all follow-up visits as told by your health care provider. This is important. Contact a health care provider if:  You have new symptoms.  Your pain is not controlled with medicines.  Your symptoms get worse. Get help right away if:  You have severe numbness or tingling in your wrist or hand. Summary  Carpal tunnel syndrome is a condition that  causes pain in your hand and arm.  It is usually caused by repeated wrist motions.  Lifestyle changes and medicines are used to treat carpal tunnel syndrome. Surgery may be recommended.  Follow your health care provider's instructions about wearing a splint, resting from activity, keeping follow-up visits, and calling for help. This information is not intended to replace advice given to you by your health care provider. Make sure you discuss any questions you have with your health care provider. Document Revised: 09/28/2017 Document Reviewed: 09/28/2017 Elsevier Patient Education  [employer].    Peripheral Neuropathy Peripheral neuropathy is a type of nerve damage. It affects nerves that carry signals between the spinal cord and the arms, legs, and the rest of the body (peripheral nerves). It does not affect nerves in the spinal cord or brain. In peripheral neuropathy, one nerve or a group of nerves may be damaged. Peripheral neuropathy is a broad category that includes many specific nerve disorders, like diabetic neuropathy, hereditary neuropathy, and carpal tunnel syndrome. What are the causes? This condition may be caused by:  Diabetes. This is the most common cause of peripheral neuropathy.  Nerve injury.  Pressure or stress on a nerve that lasts a long time.  Lack (deficiency) of B vitamins. This can result from alcoholism, poor diet, or a restricted diet.  Infections.  Autoimmune diseases, such as rheumatoid arthritis and systemic lupus erythematosus.  Nerve diseases that are passed from parent to child (inherited).  Some medicines, such as cancer medicines (chemotherapy).  Poisonous (toxic) substances, such as lead and mercury.  Too little blood flowing to the legs.  Kidney disease.  Thyroid disease. In some cases, the cause of this condition is not known. What are the signs or symptoms? Symptoms of this condition depend on which of your nerves is damaged.  Common symptoms include:  Loss of feeling (numbness) in the feet, hands, or both.  Tingling in the feet, hands, or both.  Burning pain.  Very sensitive skin.  Weakness.  Not being able to move a part of the body (paralysis).  Muscle twitching.  Clumsiness or poor coordination.  Loss of balance.  Not being able to control your bladder.  Feeling dizzy.  Sexual problems. How is this diagnosed? Diagnosing and finding the cause of peripheral neuropathy can be difficult. Your health care provider will take your medical history and do a physical exam. A neurological exam will also be done. This involves checking things that are affected by your brain, spinal cord, and nerves (nervous system). For example, your health care provider will check your reflexes, how you move, and what you can feel. You may have other tests, such as:  Blood tests.  Electromyogram (EMG) and nerve conduction tests. These tests check nerve function and how well the nerves are controlling the muscles.  Imaging tests, such as CT scans or MRI to rule out other causes of your symptoms.  Removing a small piece of nerve to be examined in a lab (nerve biopsy). This is rare.  Removing  and examining a small amount of the fluid that surrounds the brain and spinal cord (lumbar puncture). This is rare. How is this treated? Treatment for this condition may involve:  Treating the underlying cause of the neuropathy, such as diabetes, kidney disease, or vitamin deficiencies.  Stopping medicines that can cause neuropathy, such as chemotherapy.  Medicine to relieve pain. Medicines may include: ? Prescription or over-the-counter pain medicine. ? Antiseizure medicine. ? Antidepressants. ? Pain-relieving patches that are applied to painful areas of skin.  Surgery to relieve pressure on a nerve or to destroy a nerve that is causing pain.  Physical therapy to help improve movement and balance.  Devices to help you  move around (assistive devices). Follow these instructions at home: Medicines  Take over-the-counter and prescription medicines only as told by your health care provider. Do not take any other medicines without first asking your health care provider.  Do not drive or use heavy machinery while taking prescription pain medicine. Lifestyle   Do not use any products that contain nicotine or tobacco, such as cigarettes and e-cigarettes. Smoking keeps blood from reaching damaged nerves. If you need help quitting, ask your health care provider.  Avoid or limit alcohol. Too much alcohol can cause a vitamin B deficiency, and vitamin B is needed for healthy nerves.  Eat a healthy diet. This includes: ? Eating foods that are high in fiber, such as fresh fruits and vegetables, whole grains, and beans. ? Limiting foods that are high in fat and processed sugars, such as fried or sweet foods. General instructions   If you have diabetes, work closely with your health care provider to keep your blood sugar under control.  If you have numbness in your feet: ? Check every day for signs of injury or infection. Watch for redness, warmth, and swelling. ? Wear padded socks and comfortable shoes. These help protect your feet.  Develop a good support system. Living with peripheral neuropathy can be stressful. Consider talking with a mental health specialist or joining a support group.  Use assistive devices and attend physical therapy as told by your health care provider. This may include using a walker or a cane.  Keep all follow-up visits as told by your health care provider. This is important. Contact a health care provider if:  You have new signs or symptoms of peripheral neuropathy.  You are struggling emotionally from dealing with peripheral neuropathy.  Your pain is not well-controlled. Get help right away if:  You have an injury or infection that is not healing normally.  You develop new  weakness in an arm or leg.  You fall frequently. Summary  Peripheral neuropathy is when the nerves in the arms, or legs are damaged, resulting in numbness, weakness, or pain.  There are many causes of peripheral neuropathy, including diabetes, pinched nerves, vitamin deficiencies, autoimmune disease, and hereditary conditions.  Diagnosing and finding the cause of peripheral neuropathy can be difficult. Your health care provider will take your medical history, do a physical exam, and do tests, including blood tests and nerve function tests.  Treatment involves treating the underlying cause of the neuropathy and taking medicines to help control pain. Physical therapy and assistive devices may also help. This information is not intended to replace advice given to you by your health care provider. Make sure you discuss any questions you have with your health care provider. Document Revised: 05/04/2017 Document Reviewed: 07/31/2016 Elsevier Patient Education  2020 [employer].

## 2019-06-26 NOTE — Progress Notes (Signed)
History: 39 year old with numbness and tingling in both arms and in the legs seen in 2019. Today patient returns for emg/ncs. He states his symptoms have changed since last being seen in 04/2018 and this test being ordered (he rescheduled emg/ncs multiple times). However what he says today is pretty consistent with what he reported at my appointment.  I reviewed our original visit and lab work with patient as well as MRI cervical spine we performed.   - CRP 03/2019 normal, CMP/CBC unremarkable. TSH 1.037.  - We will check B12/folate today as well. No signs/symptoms of autoimmune disease or family history. - - Also recommend MRI brain w/wo contrast will order due to symptoms in the legs and physical findings unexplained (focal findings of brisk asymmetric reflexes on the left side) - MRI cervical spine did not show any etiology for upper arm symptoms. We reviewed imaging. Hand still falling asleep, left > right. Intermittent right leg weakness and sort of vague sensory changes. I discussed findings on emg/ncs of carpal tunnel and offered options such as conservative measures, injections, surgical options and provided information if he wants to do anything.  - Personally reviewed MRI cervical spine images with patient: This MRI of the cervical spine shows minimal degenerative changes at C3-C4 and C4-C5 that do not lead to foraminal narrowing or nerve root compression.  Other levels are normal.  The spinal cord appears normal.  Orders Placed This Encounter  Procedures  . MR BRAIN W WO CONTRAST  . B12 and Folate Panel  . Methylmalonic acid, serum  . NCV with EMG(electromyography)    A total of 45  minutes was spent on this patient's care, reviewing imaging, past records, recent hospitalization notes and results. Over half this time was spent on counseling patient on the  1. Paresthesias   2. Bilateral carpal tunnel syndrome    diagnosis and different diagnostic and therapeutic options, counseling and  coordination of care, risks and benefitsof management, compliance, or risk factor reduction and education.  This does not include time spent on emg/ncs procedure.

## 2019-06-26 NOTE — Progress Notes (Signed)
Full Name: Benjamin Huynh Gender: Male MRN #: [id number] Date of Birth: February 05, 1981    Visit Date: 06/26/2019 08:43 Age: 39 Years Examining Physician: [name], MD  Referring Physician: [name], DC  History:39 year old with numbness ad tingling in both arms and in the legs  Summary: Nerve Conduction Studies were performed on the bilateral upper extremities and right lower extremity..The left median APB motor nerve showed prolonged distal onset latency (4.5 ms, N<4.4). The right Median 2nd Digit orthodromic sensory nerve showed prolonged distal peak latency (3.4 ms, N<3.4). The left  Median 2nd Digit orthodromic sensory nerve showed prolonged distal peak latency (3.6 ms, N<3.4). The right median/ulnar (palm) comparison nerve showed prolonged distal peak latency (Median Palm, 2.5 ms, N<2.2) and abnormal peak latency difference (Median Palm-Ulnar Palm, 0.8 ms, N<0.4) with a relative median delay.  The left median/ulnar (palm) comparison nerve showed prolonged distal peak latency (Median Palm, 2.7 ms, N<2.2) and abnormal peak latency difference (Median Palm-Ulnar Palm, 0.9 ms, N<0.4) with a relative median delay.  All remaining nerves (as indicated in the following tables) were within normal limits.  All muscles (as indicated in the following tables) were within normal limits.       Conclusion:  1. There is electrophysiologic evidence of moderately-severe left Carpal Tunnel Syndrome and mild right Carpal Tunnel Syndrome in the upper extremities. The right lower extremity was normal.  No suggestion of polyneuropathy or radiculopathy.   [name], M.D.  [facility] [address] [place], [place] [address] Tel: [phone] Fax: [phone]     [facility]    Nerve / Sites Muscle Latency Ref. Amplitude Ref. Rel Amp Segments Distance Velocity Ref. Area    ms ms mV mV %  cm m/s m/s mVms  L Median - APB     Wrist APB 4.5 ?4.4 8.5 ?4.0 100 Wrist - APB 7   33.2     Upper  arm APB 8.0  8.3  97.4 Upper arm - Wrist 21 59 ?49 33.2  R Median - APB     Wrist APB 4.1 ?4.4 7.2 ?4.0 100 Wrist - APB 7   23.0     Upper arm APB 7.8  7.0  96.5 Upper arm - Wrist 22 60 ?49 22.5  L Ulnar - ADM     Wrist ADM 2.3 ?3.3 10.5 ?6.0 100 Wrist - ADM 7   41.9     B.Elbow ADM 5.5  10.0  95.4 B.Elbow - Wrist 20 63 ?49 40.7     A.Elbow ADM 7.0  9.8  98.2 A.Elbow - B.Elbow 10 68 ?49 39.6         A.Elbow - Wrist      R Ulnar - ADM     Wrist ADM 2.2 ?3.3 11.9 ?6.0 100 Wrist - ADM 7   38.6     B.Elbow ADM 5.3  11.8  98.8 B.Elbow - Wrist 20 64 ?49 37.3     A.Elbow ADM 6.9  11.4  96.8 A.Elbow - B.Elbow 10 65 ?49 36.7         A.Elbow - Wrist      R Peroneal - EDB     Ankle EDB 4.9 ?6.5 4.5 ?2.0 100 Ankle - EDB 9   13.4     Fib head EDB 10.3  4.1  91.4 Fib head - Ankle 28 52 ?44 12.9     Pop fossa EDB 12.3  4.0  96.1 Pop fossa - Fib head  10 52 ?44 11.9         Pop fossa - Ankle                         [facility]    Nerve / Sites Rec. Site Peak Lat Ref.  Amp Ref. Segments Distance Peak Diff Ref.    ms ms V V  cm ms ms  R Superficial peroneal - Ankle     Lat leg Ankle 3.8 ?4.4 12 ?6 Lat leg - Ankle 14    L Median, Ulnar - Transcarpal comparison     Median Palm Wrist 2.7 ?2.2 70 ?35 Median Palm - Wrist 8       Ulnar Palm Wrist 1.8 ?2.2 55 ?12 Ulnar Palm - Wrist 8          Median Palm - Ulnar Palm  0.9 ?0.4  R Median, Ulnar - Transcarpal comparison     Median Palm Wrist 2.5 ?2.2 67 ?35 Median Palm - Wrist 8       Ulnar Palm Wrist 1.7 ?2.2 42 ?12 Ulnar Palm - Wrist 8          Median Palm - Ulnar Palm  0.8 ?0.4  L Median - Orthodromic (Dig II, Mid palm)     Dig II Wrist 3.6 ?3.4 18 ?10 Dig II - Wrist 13    R Median - Orthodromic (Dig II, Mid palm)     Dig II Wrist 3.4 ?3.4 12 ?10 Dig II - Wrist 13    L Ulnar - Orthodromic, (Dig V, Mid palm)     Dig V Wrist 2.7 ?3.1 13 ?5 Dig V - Wrist 11    R Ulnar - Orthodromic, (Dig V, Mid palm)     Dig V Wrist 2.4 ?3.1 10 ?5 Dig V - Wrist 11                      F  Wave    Nerve F Lat Ref.   ms ms  L Ulnar - ADM 24.8 ?32.0  R Ulnar - ADM 25.3 ?32.0         EMG Summary Table    Spontaneous MUAP Recruitment  Muscle IA Fib PSW Fasc Other Amp Dur. Poly Pattern  L. Deltoid Normal None None None _______ Normal Normal Normal Normal  L. Triceps brachii Normal None None None _______ Normal Normal Normal Normal  L. Pronator teres Normal None None None _______ Normal Normal Normal Normal  L. First dorsal interosseous Normal None None None _______ Normal Normal Normal Normal  L. Opponens pollicis Normal None None None _______ Normal Normal Normal Normal  L. Cervical paraspinals (low) Normal None None None _______ Normal Normal Normal Normal  R. Iliopsoas Normal None None None _______ Normal Normal Normal Normal  R. Vastus medialis Normal None None None _______ Normal Normal Normal Normal  R. Tibialis anterior Normal None None None _______ Normal Normal Normal Normal  R. Gastrocnemius (Medial head) Normal None None None _______ Normal Normal Normal Normal  R. Extensor hallucis longus Normal None None None _______ Normal Normal Normal Normal  R. Biceps femoris (long head) Normal None None None _______ Normal Normal Normal Normal  R. Gluteus maximus Normal None None None _______ Normal Normal Normal Normal  R. Gluteus medius Normal None None None _______ Normal Normal Normal Normal  R. Lumbar paraspinals (low) Normal None None None _______ Normal Normal Normal Normal      

## 2019-06-30 DIAGNOSIS — M7750 Other enthesopathy of unspecified foot: Secondary | ICD-10-CM | POA: Diagnosis not present

## 2019-07-01 LAB — METHYLMALONIC ACID, SERUM: Methylmalonic Acid: 147 nmol/L (ref 0–378)

## 2019-07-01 LAB — B12 AND FOLATE PANEL
Folate: 18.6 ng/mL (ref >3.0)
Vitamin B-12: 302 pg/mL (ref 232–1245)

## 2019-07-01 NOTE — Progress Notes (Signed)
Labs normal, have a great week. Dr. Lucia Gaskins

## 2019-07-01 NOTE — Procedures (Signed)
Full Name: Benjamin Huynh Gender: Male MRN #: [id number] Date of Birth: February 05, 1981    Visit Date: 06/26/2019 08:43 Age: 39 Years Examining Physician: [name], MD  Referring Physician: [name], DC  History:39 year old with numbness ad tingling in both arms and in the legs  Summary: Nerve Conduction Studies were performed on the bilateral upper extremities and right lower extremity..The left median APB motor nerve showed prolonged distal onset latency (4.5 ms, N<4.4). The right Median 2nd Digit orthodromic sensory nerve showed prolonged distal peak latency (3.4 ms, N<3.4). The left  Median 2nd Digit orthodromic sensory nerve showed prolonged distal peak latency (3.6 ms, N<3.4). The right median/ulnar (palm) comparison nerve showed prolonged distal peak latency (Median Palm, 2.5 ms, N<2.2) and abnormal peak latency difference (Median Palm-Ulnar Palm, 0.8 ms, N<0.4) with a relative median delay.  The left median/ulnar (palm) comparison nerve showed prolonged distal peak latency (Median Palm, 2.7 ms, N<2.2) and abnormal peak latency difference (Median Palm-Ulnar Palm, 0.9 ms, N<0.4) with a relative median delay.  All remaining nerves (as indicated in the following tables) were within normal limits.  All muscles (as indicated in the following tables) were within normal limits.       Conclusion:  1. There is electrophysiologic evidence of moderately-severe left Carpal Tunnel Syndrome and mild right Carpal Tunnel Syndrome in the upper extremities. The right lower extremity was normal.  No suggestion of polyneuropathy or radiculopathy.   [name], M.D.  [facility] [address] [place], [place] [address] Tel: [phone] Fax: [phone]     [facility]    Nerve / Sites Muscle Latency Ref. Amplitude Ref. Rel Amp Segments Distance Velocity Ref. Area    ms ms mV mV %  cm m/s m/s mVms  L Median - APB     Wrist APB 4.5 ?4.4 8.5 ?4.0 100 Wrist - APB 7   33.2     Upper  arm APB 8.0  8.3  97.4 Upper arm - Wrist 21 59 ?49 33.2  R Median - APB     Wrist APB 4.1 ?4.4 7.2 ?4.0 100 Wrist - APB 7   23.0     Upper arm APB 7.8  7.0  96.5 Upper arm - Wrist 22 60 ?49 22.5  L Ulnar - ADM     Wrist ADM 2.3 ?3.3 10.5 ?6.0 100 Wrist - ADM 7   41.9     B.Elbow ADM 5.5  10.0  95.4 B.Elbow - Wrist 20 63 ?49 40.7     A.Elbow ADM 7.0  9.8  98.2 A.Elbow - B.Elbow 10 68 ?49 39.6         A.Elbow - Wrist      R Ulnar - ADM     Wrist ADM 2.2 ?3.3 11.9 ?6.0 100 Wrist - ADM 7   38.6     B.Elbow ADM 5.3  11.8  98.8 B.Elbow - Wrist 20 64 ?49 37.3     A.Elbow ADM 6.9  11.4  96.8 A.Elbow - B.Elbow 10 65 ?49 36.7         A.Elbow - Wrist      R Peroneal - EDB     Ankle EDB 4.9 ?6.5 4.5 ?2.0 100 Ankle - EDB 9   13.4     Fib head EDB 10.3  4.1  91.4 Fib head - Ankle 28 52 ?44 12.9     Pop fossa EDB 12.3  4.0  96.1 Pop fossa - Fib head  10 52 ?44 11.9         Pop fossa - Ankle                         SN[facility]    Nerve / Sites Rec. Site Peak Lat Ref.  Amp Ref. Segments Distance Peak Diff Ref.    ms ms V V  cm ms ms  R Superficial peroneal - Ankle     Lat leg Ankle 3.8 ?4.4 12 ?6 Lat leg - Ankle 14    L Median, Ulnar - Transcarpal comparison     Median Palm Wrist 2.7 ?2.2 70 ?35 Median Palm - Wrist 8       Ulnar Palm Wrist 1.8 ?2.2 55 ?12 Ulnar Palm - Wrist 8          Median Palm - Ulnar Palm  0.9 ?0.4  R Median, Ulnar - Transcarpal comparison     Median Palm Wrist 2.5 ?2.2 67 ?35 Median Palm - Wrist 8       Ulnar Palm Wrist 1.7 ?2.2 42 ?12 Ulnar Palm - Wrist 8          Median Palm - Ulnar Palm  0.8 ?0.4  L Median - Orthodromic (Dig II, Mid palm)     Dig II Wrist 3.6 ?3.4 18 ?10 Dig II - Wrist 13    R Median - Orthodromic (Dig II, Mid palm)     Dig II Wrist 3.4 ?3.4 12 ?10 Dig II - Wrist 13    L Ulnar - Orthodromic, (Dig V, Mid palm)     Dig V Wrist 2.7 ?3.1 13 ?5 Dig V - Wrist 11    R Ulnar - Orthodromic, (Dig V, Mid palm)     Dig V Wrist 2.4 ?3.1 10 ?5 Dig V - Wrist 64                      F  Wave    Nerve F Lat Ref.   ms ms  L Ulnar - ADM 24.8 ?32.0  R Ulnar - ADM 25.3 ?32.0         EMG Summary Table    Spontaneous MUAP Recruitment  Muscle IA Fib PSW Fasc Other Amp Dur. Poly Pattern  L. Deltoid Normal None None None _______ Normal Normal Normal Normal  L. Triceps brachii Normal None None None _______ Normal Normal Normal Normal  L. Pronator teres Normal None None None _______ Normal Normal Normal Normal  L. First dorsal interosseous Normal None None None _______ Normal Normal Normal Normal  L. Opponens pollicis Normal None None None _______ Normal Normal Normal Normal  L. Cervical paraspinals (low) Normal None None None _______ Normal Normal Normal Normal  R. Iliopsoas Normal None None None _______ Normal Normal Normal Normal  R. Vastus medialis Normal None None None _______ Normal Normal Normal Normal  R. Tibialis anterior Normal None None None _______ Normal Normal Normal Normal  R. Gastrocnemius (Medial head) Normal None None None _______ Normal Normal Normal Normal  R. Extensor hallucis longus Normal None None None _______ Normal Normal Normal Normal  R. Biceps femoris (long head) Normal None None None _______ Normal Normal Normal Normal  R. Gluteus maximus Normal None None None _______ Normal Normal Normal Normal  R. Gluteus medius Normal None None None _______ Normal Normal Normal Normal  R. Lumbar paraspinals (low) Normal None None None _______ Normal Normal Normal Normal

## 2019-09-09 DIAGNOSIS — J310 Chronic rhinitis: Secondary | ICD-10-CM | POA: Diagnosis not present

## 2019-09-09 DIAGNOSIS — J069 Acute upper respiratory infection, unspecified: Secondary | ICD-10-CM | POA: Diagnosis not present

## 2019-11-26 DIAGNOSIS — S060X0D Concussion without loss of consciousness, subsequent encounter: Secondary | ICD-10-CM | POA: Diagnosis not present

## 2020-01-21 DIAGNOSIS — R05 Cough: Secondary | ICD-10-CM | POA: Diagnosis not present

## 2020-01-21 DIAGNOSIS — Z20828 Contact with and (suspected) exposure to other viral communicable diseases: Secondary | ICD-10-CM | POA: Diagnosis not present

## 2020-04-01 DIAGNOSIS — M5411 Radiculopathy, occipito-atlanto-axial region: Secondary | ICD-10-CM | POA: Diagnosis not present

## 2020-04-01 DIAGNOSIS — M9903 Segmental and somatic dysfunction of lumbar region: Secondary | ICD-10-CM | POA: Diagnosis not present

## 2020-06-28 DIAGNOSIS — G629 Polyneuropathy, unspecified: Secondary | ICD-10-CM | POA: Diagnosis not present

## 2020-06-28 DIAGNOSIS — Z0000 Encounter for general adult medical examination without abnormal findings: Secondary | ICD-10-CM | POA: Diagnosis not present

## 2020-06-28 DIAGNOSIS — Z8249 Family history of ischemic heart disease and other diseases of the circulatory system: Secondary | ICD-10-CM | POA: Diagnosis not present

## 2020-06-28 DIAGNOSIS — E782 Mixed hyperlipidemia: Secondary | ICD-10-CM | POA: Diagnosis not present

## 2020-06-29 DIAGNOSIS — Z0000 Encounter for general adult medical examination without abnormal findings: Secondary | ICD-10-CM | POA: Diagnosis not present

## 2020-06-29 DIAGNOSIS — Z91030 Bee allergy status: Secondary | ICD-10-CM | POA: Diagnosis not present

## 2020-06-29 DIAGNOSIS — G629 Polyneuropathy, unspecified: Secondary | ICD-10-CM | POA: Diagnosis not present

## 2021-02-03 DIAGNOSIS — D224 Melanocytic nevi of scalp and neck: Secondary | ICD-10-CM | POA: Diagnosis not present

## 2021-02-03 DIAGNOSIS — D2262 Melanocytic nevi of left upper limb, including shoulder: Secondary | ICD-10-CM | POA: Diagnosis not present

## 2021-02-03 DIAGNOSIS — D2239 Melanocytic nevi of other parts of face: Secondary | ICD-10-CM | POA: Diagnosis not present

## 2021-02-03 DIAGNOSIS — D225 Melanocytic nevi of trunk: Secondary | ICD-10-CM | POA: Diagnosis not present

## 2021-02-10 ENCOUNTER — Encounter (HOSPITAL_BASED_OUTPATIENT_CLINIC_OR_DEPARTMENT_OTHER): Payer: Self-pay | Admitting: Emergency Medicine

## 2021-02-10 ENCOUNTER — Other Ambulatory Visit: Payer: Self-pay

## 2021-02-10 ENCOUNTER — Emergency Department (HOSPITAL_BASED_OUTPATIENT_CLINIC_OR_DEPARTMENT_OTHER)
Admission: EM | Admit: 2021-02-10 | Discharge: 2021-02-10 | Disposition: A | Discharge status: Home / Self Care | Payer: BC Managed Care – PPO | Attending: Emergency Medicine | Admitting: Emergency Medicine

## 2021-02-10 DIAGNOSIS — F17290 Nicotine dependence, other tobacco product, uncomplicated: Secondary | ICD-10-CM | POA: Insufficient documentation

## 2021-02-10 DIAGNOSIS — Z574 Occupational exposure to toxic agents in agriculture: Secondary | ICD-10-CM | POA: Insufficient documentation

## 2021-02-10 DIAGNOSIS — E232 Diabetes insipidus: Secondary | ICD-10-CM | POA: Diagnosis not present

## 2021-02-10 DIAGNOSIS — Z77098 Contact with and (suspected) exposure to other hazardous, chiefly nonmedicinal, chemicals: Secondary | ICD-10-CM | POA: Diagnosis not present

## 2021-02-10 DIAGNOSIS — R202 Paresthesia of skin: Secondary | ICD-10-CM | POA: Insufficient documentation

## 2021-02-10 DIAGNOSIS — Z77128 Contact with and (suspected) exposure to other hazards in the physical environment: Secondary | ICD-10-CM

## 2021-02-10 DIAGNOSIS — R9431 Abnormal electrocardiogram [ECG] [EKG]: Secondary | ICD-10-CM | POA: Diagnosis not present

## 2021-02-10 NOTE — ED Provider Notes (Signed)
[facility]ENTER [facility] EMERGENCY DEPT Provider Note   CSN: [id number] Arrival date & time: 02/10/21  1525     History Chief Complaint  Patient presents with   Chemical Exposure    [facility] Montez Hageman. is a 40 y.o. male with no pertinent past medical history presents today for evaluation of a chemical exposure.  He was at work when he hit a irrigation line while mowing and then was exposed to K-pam HL.  He states that it sprayed on him and that when it comes in contact with water turned into a paper so he suspects he breathed in the vapor also.  He then immediately went and showered.  He states that he washed himself off completely 3 times with soap and changed out of his soiled clothing.  This happened outside and he was not in a enclosed space with the fumes other than while showering.  He states that he has been dry heaving and was having a lot of nasal discharge.  He denies any loss of consciousness.  He states that currently he feels tingly in his hands and feet bilaterally.  The exposure occurred at about 2:10 PM today.  Currently he also still feels nauseated, however denies any pain.  He states after the exposure he did drink "a bunch of water" and has not vomited.  No chest pain/tightness, no shortness of breath.   HPI     Past Medical History:  Diagnosis Date   Cushings syndrome (HCC)    Diabetes insipidus (HCC)    HX OF    Patient Active Problem List   Diagnosis Date Noted   Chest pain 01/28/2014   Family history of early CAD 01/28/2014   Hoarseness of voice 01/28/2014   History of pituitary surgery 01/28/2014    Past Surgical History:  Procedure Laterality Date   PITITUARY  SX   1998  AND  2005   CUSHING  SYNDROME   polyp removal     VOCAL CHORD   SPOTS  REMOVED   10-2013       Family History  Problem Relation Age of Onset   Melanoma Mother    CAD Mother        stents   Hodgkin's lymphoma Mother    Melanoma Father    Heart attack Father    Heart  disease Father    Other Sister        meningitis   Heart attack Paternal Grandmother    Heart attack Paternal Grandfather    Breast cancer Other        multiple family members    Social History   Tobacco Use   Smoking status: Light Smoker    Types: Cigars   Smokeless tobacco: Never   Tobacco comments:    very rare  Vaping Use   Vaping Use: Former   Devices: tried once a long time ago  Substance Use Topics   Alcohol use: Yes    Alcohol/week: 10.0 standard drinks    Types: 10 Cans of beer per week    Comment: occ   Drug use: Not Currently    Home Medications Prior to Admission medications   Not on File    Allergies    Bee venom, Omeprazole, and Prilosec [omeprazole]  Review of Systems   Review of Systems  Constitutional:  Negative for chills and fever.  HENT:  Positive for rhinorrhea. Negative for drooling.   Eyes:  Negative for visual disturbance.  Respiratory:  Positive for cough (  Occasional, not continous.).   Cardiovascular:  Negative for chest pain.  Gastrointestinal:  Positive for nausea. Negative for abdominal pain, diarrhea and vomiting.  Musculoskeletal:  Negative for back pain and neck pain.  Skin:  Negative for color change, rash and wound.  Neurological:  Negative for dizziness, tremors, speech difficulty, weakness, light-headedness and headaches.  Psychiatric/Behavioral:  Negative for confusion.   All other systems reviewed and are negative.  Physical Exam Updated Vital Signs BP 126/88 (BP Location: Right Arm)   Pulse 69   Temp 98.5 F (36.9 C) (Oral)   Resp (!) 22   Ht 5\' 9"  (1.753 m)   Wt 74.8 kg   SpO2 100%   BMI 24.37 kg/m   Physical Exam Vitals and nursing note reviewed.  Constitutional:      General: He is not in acute distress.    Appearance: He is not ill-appearing.  HENT:     Head: Normocephalic and atraumatic.     Nose: Nose normal.     Mouth/Throat:     Mouth: Mucous membranes are moist.     Pharynx: Oropharynx is clear.      Comments: Normal phonation, no elevation of the floor of the mouth, no oropharyngeal edema or erythema. Eyes:     Conjunctiva/sclera: Conjunctivae normal.  Cardiovascular:     Rate and Rhythm: Normal rate and regular rhythm.     Pulses: Normal pulses.     Heart sounds: Normal heart sounds.  Pulmonary:     Effort: Pulmonary effort is normal. No respiratory distress.     Breath sounds: Normal breath sounds. No stridor. No wheezing or rhonchi.  Abdominal:     General: There is no distension.     Tenderness: There is no abdominal tenderness.  Musculoskeletal:     Cervical back: Normal range of motion and neck supple.     Comments: No obvious acute injury  Skin:    General: Skin is warm.     Comments: No abnormal redness visualized.  No wounds visualized  Neurological:     Mental Status: He is alert.     Comments: Awake and alert, answers all questions appropriately.  Speech is not slurred.  Psychiatric:        Mood and Affect: Mood normal.        Behavior: Behavior normal.    ED Results / Procedures / Treatments   Labs (all labs ordered are listed, but only abnormal results are displayed) Labs Reviewed - No data to display  EKG EKG Interpretation  Date/Time:  Thursday February 10 2021 15:37:20 EDT Ventricular Rate:  73 PR Interval:  162 QRS Duration: 88 QT Interval:  378 QTC Calculation: 416 R Axis:   80 Text Interpretation: Normal sinus rhythm with sinus arrhythmia Nonspecific ST abnormality Abnormal ECG No old tracing to compare Confirmed by 07[birth year]1989 [phone]) on 02/10/2021 6:23:27 PM  Radiology No results found.  Procedures Procedures   Medications Ordered in ED Medications - No data to display  ED Course  I have reviewed the triage vital signs and the nursing notes.  Pertinent labs & imaging results that were available during my care of the patient were reviewed by me and considered in my medical decision making (see chart for details).  Clinical  Course as of 02/10/21 2211  Thu Feb 10, 2021  1614 I spoke with 1615 with [facility] poison control.  The primary treatment is Decon which patient has already done.  Recommended monitoring, treating any bronchospasm is normal,  getting a chest x-ray of abnormal lung sounds.  Recommended monitoring for an additional 2 hours.  [EH]    Clinical Course User Index [EH] Norman Clay   MDM Rules/Calculators/A&P                          Patient is a otherwise healthy 40 year old male who presents today for evaluation after a reported occupational chemical exposure to a soil fumigant called K-PAM HL.  MDS reviewed.  I spoke with poison control, please see note in clinical course.    My exam patient is generally well-appearing.  He did report some slight dizziness earlier however this has fully resolved by the time of my initial evaluation in triage and did not return while in the ER.  He initially reportedly had cough however this is also fully resolved.  He is observed in the emergency room for about 5 hours since the incident and was in the department for a total of 4 hours without any significant changes in condition or worsening.  His lungs are clear to auscultation bilaterally and he was hemodynamically stable.  He already significantly declined prior to arrival in the ER.  Return precautions were discussed with patient who states their understanding.  At the time of discharge patient denied any unaddressed complaints or concerns.  Patient is agreeable for discharge home.  Note: Portions of this report may have been transcribed using voice recognition software. Every effort was made to ensure accuracy; however, inadvertent computerized transcription errors may be present   Final Clinical Impression(s) / ED Diagnoses Final diagnoses:  Occupational exposure to pesticides  Exposure to hazardous chemical    Rx / DC Orders ED Discharge Orders     None        Norman Clay 02/10/21 2214    Rolan Bucco, MD 02/11/21 1546

## 2021-02-10 NOTE — ED Notes (Signed)
Patient states feels Ok.  No SOB;  No rash noted.  Continue to monitor

## 2021-02-10 NOTE — ED Notes (Signed)
States got hit with a liquid chemical which Potassium N-methyldithiocarbanate (KPAM) which sprayed into right side of his body and face.  Had dry heaving on site.  Shower and changed clothes before arrival. Denies any symptoms at present.  States was a"a little dizzy on arrival"  But denies dizziness at present.  Denies SOB or chest pain.  No rash noted.

## 2021-02-10 NOTE — Discharge Instructions (Signed)
If you develop any new or concerning symptoms, or have any worsening please seek additional medical care and evaluation.

## 2021-02-10 NOTE — ED Triage Notes (Signed)
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 Pt reports showering within 5 mins of exposure. SDS printed. Pt endorses nausea and "feeling tingly". Pt coughing in triage. Pt able to speak in complete sentences. EDP Belfi updated.

## 2021-02-10 NOTE — ED Provider Notes (Signed)
Emergency Medicine Provider Triage Evaluation Note  [facility]. , a 40 y.o. male  was evaluated in triage.  Pt complains of hazardous exposure.  He reports that he was exposed to K-pam. At 210.  He showered and washed his body three times.  He has been dry heaving and drank " a bunch of water."    Currently he feels tingley in his hands and feet and feels nauseated. No pain.   Review of Systems  Positive: nausea Negative: Vomiting, syncope  Physical Exam  BP 122/87 (BP Location: Left Arm)   Pulse 82   Temp 98.5 F (36.9 C) (Oral)   Resp 20   Ht 5\' 9"  (1.753 m)   Wt 74.8 kg   SpO2 100%   BMI 24.37 kg/m  Gen:   Awake, no distress   Resp:  Normal effort  MSK:   Moves extremities without difficulty  Other:  Patient is awake and alert, answers questions with out difficulty.   Medical Decision Making  Medically screening exam initiated at 3:44 PM.  Appropriate orders placed.  [facility]. was informed that the remainder of the evaluation will be completed by another provider, this initial triage assessment does not replace that evaluation, and the importance of remaining in the ED until their evaluation is complete.     [employer], PA-C 02/10/21 1550    04/12/21, MD 02/11/21 1546

## 2021-02-15 DIAGNOSIS — M9903 Segmental and somatic dysfunction of lumbar region: Secondary | ICD-10-CM | POA: Diagnosis not present

## 2021-02-15 DIAGNOSIS — M5411 Radiculopathy, occipito-atlanto-axial region: Secondary | ICD-10-CM | POA: Diagnosis not present

## 2021-03-09 DIAGNOSIS — M9903 Segmental and somatic dysfunction of lumbar region: Secondary | ICD-10-CM | POA: Diagnosis not present

## 2021-03-09 DIAGNOSIS — M5411 Radiculopathy, occipito-atlanto-axial region: Secondary | ICD-10-CM | POA: Diagnosis not present

## 2021-03-31 DIAGNOSIS — M9903 Segmental and somatic dysfunction of lumbar region: Secondary | ICD-10-CM | POA: Diagnosis not present

## 2021-03-31 DIAGNOSIS — M5411 Radiculopathy, occipito-atlanto-axial region: Secondary | ICD-10-CM | POA: Diagnosis not present

## 2021-04-18 ENCOUNTER — Encounter: Payer: Self-pay | Admitting: Cardiology

## 2021-04-18 ENCOUNTER — Ambulatory Visit: Payer: BC Managed Care – PPO | Admitting: Cardiology

## 2021-04-18 ENCOUNTER — Other Ambulatory Visit: Payer: Self-pay

## 2021-04-18 VITALS — BP 130/75 | HR 67 | Temp 98.3°F | Wt 165.0 lb

## 2021-04-18 DIAGNOSIS — Z8249 Family history of ischemic heart disease and other diseases of the circulatory system: Secondary | ICD-10-CM

## 2021-04-18 DIAGNOSIS — E782 Mixed hyperlipidemia: Secondary | ICD-10-CM | POA: Diagnosis not present

## 2021-04-18 DIAGNOSIS — Z136 Encounter for screening for cardiovascular disorders: Secondary | ICD-10-CM | POA: Diagnosis not present

## 2021-04-18 NOTE — Progress Notes (Signed)
Patient referred by [name], MD for family history of early coronary artery disease  Subjective:   Benjamin Leitz., male    DOB: 07-04-1980, 40 y.o.   MRN: [id number]   Chief Complaint  Patient presents with   Coronary Artery Disease    Family Hx   New Patient (Initial Visit)    Referred by Dr. [name]    HPI  49 y.o. 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  He stays active, playing tennis couple times a week without any symptoms of chest pain or shortness of breath.  He has strong family history of coronary artery disease.  His father had MI at age 85.  He has few other family worse on paternal side also with coronary artery disease.  There is no strong family history of hyperlipidemia on his father side.  However, there is, possibly familial hypercholesterolemia history on his mother side, fortunately without any early premature coronary artery disease.  His mother did have stent placed in her 95s.  Patient reportedly underwent CT cardiac scoring scan over 5 years ago, which reportedly showed calcium score of 0.  His LDL is 159, HDL is 59.  He has been eating predominantly plant-based diet until recently when he admits to have had changes to his diet after having kids.   Past Medical History:  Diagnosis Date   Cushings syndrome ([place])    Diabetes insipidus ([place])    HX OF     Past Surgical History:  Procedure Laterality Date   PITITUARY  SX   1998  AND  2005   CUSHING  SYNDROME   polyp removal     VOCAL CHORD   SPOTS  REMOVED   10-2013     Social History   Tobacco Use  Smoking Status Light Smoker   Types: Cigars  Smokeless Tobacco Never  Tobacco Comments   very rare    Social History   Substance and Sexual Activity  Alcohol Use Yes   Alcohol/week: 10.0 standard drinks   Types: 10 Cans of beer per week   Comment: occ      Family History  Problem Relation Age of Onset   Melanoma Mother    CAD Mother        stents   Hodgkin's lymphoma Mother    Melanoma Father    Heart attack Father    Heart disease Father    Other Sister        meningitis   Heart attack Paternal Grandmother    Heart attack Paternal Grandfather    Breast cancer Other        multiple family members     No current outpatient medications on file prior to visit.   No current facility-administered medications on file prior to visit.    Cardiovascular and other pertinent studies:  EKG 04/18/2021: Sinus rhythm 56 bpm Normal EKG  Recent labs: 06/28/2020: Glucose 103, BUN/Cr 8/0.7. EGFR >90. Na/K 139/4.0. Rest of the CMP normal H/H 14/42. MCV 87. Platelets 201 HbA1C N/A Chol 224, TG 79, HDL 59, LDL 159 TSH 0.7 normal    Review of Systems  Cardiovascular:  Negative for chest pain, dyspnea on exertion, leg swelling, palpitations and syncope.        Vitals:   04/18/21 1002 04/18/21 1004  BP: (!) 146/83 130/75  Pulse: 80 67  Temp: 98.3 F (36.8  C)   SpO2: 96% 96%     Objective:   Physical Exam Vitals and nursing note reviewed.  Constitutional:      General: He is not in acute distress. Neck:     Vascular: No JVD.  Cardiovascular:     Rate and Rhythm: Normal rate and regular rhythm.     Heart sounds: Normal heart sounds. No murmur heard. Pulmonary:     Effort: Pulmonary effort is normal.     Breath sounds: Normal breath sounds. No wheezing or rales.  Musculoskeletal:     Right lower leg: No edema.     Left lower leg: No edema.        Assessment & Recommendations:    40 y.o. Caucasian male with mixed hyperlipidemia, family history of early coronary artery disease  Discussed modifiable and nonmodifiable risk factors.  He lives fairly healthy lifestyle.  I asked him to make further changes to his diet to include patient's primary meat source, levels cooking oil, more fruits vegetables, whole grains,  nuts, occasional red wine.  Recommend CT cardiac scoring for risk stratification.  He is not excited about statin use, but is willing to start the same, if necessary.  I will make further recommendations based on calcium score scan.  Otherwise, we will repeat lipid panel, along with lipoprotein a in 6 months.  Further recommendations after above testing.  Thank you for referring the patient to [place]. Please feel free to contact with any questions.   Nigel Mormon, MD Pager: [phone] Office: [phone]

## 2021-04-29 DIAGNOSIS — J111 Influenza due to unidentified influenza virus with other respiratory manifestations: Secondary | ICD-10-CM | POA: Diagnosis not present

## 2021-05-02 DIAGNOSIS — J209 Acute bronchitis, unspecified: Secondary | ICD-10-CM | POA: Diagnosis not present

## 2021-05-02 DIAGNOSIS — J111 Influenza due to unidentified influenza virus with other respiratory manifestations: Secondary | ICD-10-CM | POA: Diagnosis not present

## 2021-05-12 DIAGNOSIS — M9903 Segmental and somatic dysfunction of lumbar region: Secondary | ICD-10-CM | POA: Diagnosis not present

## 2021-05-12 DIAGNOSIS — M5411 Radiculopathy, occipito-atlanto-axial region: Secondary | ICD-10-CM | POA: Diagnosis not present

## 2021-05-16 ENCOUNTER — Ambulatory Visit
Admission: RE | Admit: 2021-05-16 | Discharge: 2021-05-16 | Disposition: A | Discharge status: Home / Self Care | Payer: BC Managed Care – PPO | Source: Ambulatory Visit | Attending: Cardiology | Admitting: Cardiology

## 2021-05-16 DIAGNOSIS — E782 Mixed hyperlipidemia: Secondary | ICD-10-CM

## 2021-05-16 DIAGNOSIS — Z8249 Family history of ischemic heart disease and other diseases of the circulatory system: Secondary | ICD-10-CM

## 2021-10-17 ENCOUNTER — Ambulatory Visit: Payer: BC Managed Care – PPO | Admitting: Cardiology

## 2021-10-24 ENCOUNTER — Ambulatory Visit: Payer: BC Managed Care – PPO | Admitting: Cardiology

## 2021-10-24 NOTE — Progress Notes (Incomplete)
    Patient referred by [name], MD for family history of early coronary artery disease  Subjective:   Benjamin Huynh., male    DOB: May 04, 1981, 41 y.o.   MRN: [id number]   No chief complaint on file.   HPI  41 y.o. 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  He stays active, playing tennis couple times a week without any symptoms of chest pain or shortness of breath.  He has strong family history of coronary artery disease.  His father had MI at age 34.  He has few other family worse on paternal side also with coronary artery disease.  There is no strong family history of hyperlipidemia on his father side.  However, there is, possibly familial hypercholesterolemia history on his mother side, fortunately without any early premature coronary artery disease.  His mother did have stent placed in her 76s.  Patient reportedly underwent CT cardiac scoring scan over 5 years ago, which reportedly showed calcium score of 0.  His LDL is 159, HDL is 59.  He has been eating predominantly plant-based diet until recently when he admits to have had changes to his diet after having kids.  No current outpatient medications on file.  Cardiovascular and other pertinent studies:  EKG 04/18/2021: Sinus rhythm 56 bpm Normal EKG  Recent labs: 06/28/2020: Glucose 103, BUN/Cr 8/0.7. EGFR >90. Na/K 139/4.0. Rest of the CMP normal H/H 14/42. MCV 87. Platelets 201 HbA1C N/A Chol 224, TG 79, HDL 59, LDL 159 TSH 0.7 normal    Review of Systems  Cardiovascular:  Negative for chest pain, dyspnea on exertion, leg swelling, palpitations and syncope.        There were no vitals filed for this visit.    Objective:   Physical Exam Vitals and nursing note reviewed.  Constitutional:      General: He is not in acute distress. Neck:     Vascular: No JVD.  Cardiovascular:      Rate and Rhythm: Normal rate and regular rhythm.     Heart sounds: Normal heart sounds. No murmur heard. Pulmonary:     Effort: Pulmonary effort is normal.     Breath sounds: Normal breath sounds. No wheezing or rales.  Musculoskeletal:     Right lower leg: No edema.     Left lower leg: No edema.        Assessment & Recommendations:    41 y.o. Caucasian male with mixed hyperlipidemia, family history of early coronary artery disease  Discussed modifiable and nonmodifiable risk factors.  He lives fairly healthy lifestyle.  I asked him to make further changes to his diet to include patient's primary meat source, levels cooking oil, more fruits vegetables, whole grains, nuts, occasional red wine.  Recommend CT cardiac scoring for risk stratification.  He is not excited about statin use, but is willing to start the same, if necessary.  I will make further recommendations based on calcium score scan.  Otherwise, we will repeat lipid panel, along with lipoprotein a in 6 months.  Further recommendations after above testing.  Thank you for referring the patient to [place]. Please feel free to contact with any questions.   Nigel Mormon, MD Pager: [phone] Office: [phone]

## 2021-11-11 DIAGNOSIS — J020 Streptococcal pharyngitis: Secondary | ICD-10-CM | POA: Diagnosis not present

## 2021-11-15 DIAGNOSIS — R051 Acute cough: Secondary | ICD-10-CM | POA: Diagnosis not present

## 2021-11-15 DIAGNOSIS — J020 Streptococcal pharyngitis: Secondary | ICD-10-CM | POA: Diagnosis not present

## 2022-03-09 DIAGNOSIS — M5411 Radiculopathy, occipito-atlanto-axial region: Secondary | ICD-10-CM | POA: Diagnosis not present

## 2022-03-09 DIAGNOSIS — M9903 Segmental and somatic dysfunction of lumbar region: Secondary | ICD-10-CM | POA: Diagnosis not present

## 2022-03-20 ENCOUNTER — Ambulatory Visit (INDEPENDENT_AMBULATORY_CARE_PROVIDER_SITE_OTHER): Payer: BC Managed Care – PPO

## 2022-03-20 ENCOUNTER — Ambulatory Visit (INDEPENDENT_AMBULATORY_CARE_PROVIDER_SITE_OTHER): Payer: BC Managed Care – PPO | Admitting: Podiatry

## 2022-03-20 DIAGNOSIS — M7752 Other enthesopathy of left foot: Secondary | ICD-10-CM | POA: Diagnosis not present

## 2022-03-20 DIAGNOSIS — M779 Enthesopathy, unspecified: Secondary | ICD-10-CM

## 2022-03-20 NOTE — Progress Notes (Signed)
   Chief Complaint  Patient presents with   Foot Pain    Patient is here for left foot achilies tendon pain, the patient states that he has had pain for a couple of years.    HPI: 41 y.o. male presenting today as a new patient for evaluation of intermittent ankle pain that has been ongoing for about 2 years now.  Patient states that about 2 years ago he jumped off of a tall tractor tire and injured his left ankle.  At that time most of the ankle pain was located to the anterior aspect of the ankle.  It has been painful and tender ever since.  He now gets occasional Achilles tendinitis along the posterior aspect of the leg as well.  Patient states that he likes to be active however he has continued pain which will resolve and then return depending on activity.  Presenting for further treatment and evaluation  Past Medical History:  Diagnosis Date   Cushings syndrome ([place])    Diabetes insipidus ([place])    HX OF    Past Surgical History:  Procedure Laterality Date   PITITUARY  SX   1998  AND  2005   CUSHING  SYNDROME   polyp removal     VOCAL CHORD   SPOTS  REMOVED   10-2013    Allergies  Allergen Reactions   Bee Venom Anaphylaxis   Omeprazole Other (See Comments)    "I just didn't feel good"   Prilosec [Omeprazole] Other (See Comments)    WEAKNESS     Physical Exam: General: The patient is alert and oriented x3 in no acute distress.  Dermatology: Skin is warm, dry and supple bilateral lower extremities. Negative for open lesions or macerations.  Vascular: Palpable pedal pulses bilaterally. Capillary refill within normal limits.  Negative for any significant edema or erythema  Neurological: Light touch and protective threshold grossly intact  Musculoskeletal Exam: No pedal deformities noted.  Today there is no appreciable pinpoint tenderness or pain.  Muscle strength is 5/5 all compartments.  Radiographic Exam LT foot and ankle 03/20/2022:  Normal osseous mineralization. Joint  spaces preserved. No fracture/dislocation/boney destruction.    Assessment: 1.  Chronic intermittent left ankle pain x2 years   Plan of Care:  1. Patient evaluated. X-Rays reviewed.  2.  Today we are going to order an MRI given the history of injury and the nonhealing nature of his ankle.  Order placed MRI LT ankle without contrast 3.  In the meantime recommend good supportive shoes and sneakers 4.  The patient has tried ankle bracing in the past and hightop hiking shoes with no relief. 5. Will contact patient via telephone with results and to discuss further treatment options      Edrick Kins, DPM [facility]  Dr. Edrick Kins, DPM    [address], [place] [address]                Office [phone]  Fax [phone]

## 2022-04-03 DIAGNOSIS — M25511 Pain in right shoulder: Secondary | ICD-10-CM | POA: Diagnosis not present

## 2022-04-10 ENCOUNTER — Encounter: Payer: Self-pay | Admitting: Podiatry

## 2022-04-11 ENCOUNTER — Ambulatory Visit
Admission: RE | Admit: 2022-04-11 | Discharge: 2022-04-11 | Disposition: A | Discharge status: Home / Self Care | Payer: BC Managed Care – PPO | Source: Ambulatory Visit | Attending: Podiatry | Admitting: Podiatry

## 2022-04-11 DIAGNOSIS — R600 Localized edema: Secondary | ICD-10-CM | POA: Diagnosis not present

## 2022-04-11 DIAGNOSIS — M7752 Other enthesopathy of left foot: Secondary | ICD-10-CM

## 2022-04-11 DIAGNOSIS — M25562 Pain in left knee: Secondary | ICD-10-CM | POA: Diagnosis not present

## 2022-04-11 DIAGNOSIS — M898X7 Other specified disorders of bone, ankle and foot: Secondary | ICD-10-CM | POA: Diagnosis not present

## 2022-04-13 DIAGNOSIS — M9903 Segmental and somatic dysfunction of lumbar region: Secondary | ICD-10-CM | POA: Diagnosis not present

## 2022-04-13 DIAGNOSIS — M5411 Radiculopathy, occipito-atlanto-axial region: Secondary | ICD-10-CM | POA: Diagnosis not present

## 2022-04-19 DIAGNOSIS — M06072 Rheumatoid arthritis without rheumatoid factor, left ankle and foot: Secondary | ICD-10-CM | POA: Diagnosis not present

## 2022-04-20 ENCOUNTER — Other Ambulatory Visit: Payer: Self-pay | Admitting: Orthopaedic Surgery

## 2022-04-20 DIAGNOSIS — M25572 Pain in left ankle and joints of left foot: Secondary | ICD-10-CM

## 2022-04-26 ENCOUNTER — Ambulatory Visit
Admission: RE | Admit: 2022-04-26 | Discharge: 2022-04-26 | Disposition: A | Discharge status: Home / Self Care | Payer: BC Managed Care – PPO | Source: Ambulatory Visit | Attending: Orthopaedic Surgery | Admitting: Orthopaedic Surgery

## 2022-04-26 DIAGNOSIS — M93272 Osteochondritis dissecans, left ankle and joints of left foot: Secondary | ICD-10-CM | POA: Diagnosis not present

## 2022-04-26 DIAGNOSIS — M25572 Pain in left ankle and joints of left foot: Secondary | ICD-10-CM

## 2022-04-26 DIAGNOSIS — M958 Other specified acquired deformities of musculoskeletal system: Secondary | ICD-10-CM | POA: Diagnosis not present

## 2022-05-02 NOTE — Progress Notes (Signed)
Tried calling patient.  No answer.  Left voicemail.-Dr. Logan Bores

## 2022-05-03 ENCOUNTER — Encounter: Payer: Self-pay | Admitting: Podiatry

## 2022-05-03 DIAGNOSIS — Z136 Encounter for screening for cardiovascular disorders: Secondary | ICD-10-CM | POA: Diagnosis not present

## 2022-05-03 DIAGNOSIS — R079 Chest pain, unspecified: Secondary | ICD-10-CM | POA: Diagnosis not present

## 2022-05-03 DIAGNOSIS — R002 Palpitations: Secondary | ICD-10-CM | POA: Diagnosis not present

## 2022-05-03 DIAGNOSIS — Z8249 Family history of ischemic heart disease and other diseases of the circulatory system: Secondary | ICD-10-CM | POA: Diagnosis not present

## 2022-05-03 DIAGNOSIS — R0602 Shortness of breath: Secondary | ICD-10-CM | POA: Diagnosis not present

## 2022-05-03 DIAGNOSIS — I361 Nonrheumatic tricuspid (valve) insufficiency: Secondary | ICD-10-CM | POA: Diagnosis not present

## 2022-05-03 NOTE — Progress Notes (Signed)
Tried calling patient again.  No answer.  Sent patient a [employer] geologist.  Hopefully can arrange a time to talk over the phone.-Dr. Logan Bores

## 2022-05-05 DIAGNOSIS — M25572 Pain in left ankle and joints of left foot: Secondary | ICD-10-CM | POA: Diagnosis not present

## 2022-05-05 DIAGNOSIS — M93272 Osteochondritis dissecans, left ankle and joints of left foot: Secondary | ICD-10-CM | POA: Diagnosis not present

## 2022-05-08 NOTE — Progress Notes (Signed)
Spoke with patient via telephone over the weekend. Discussed MRI findings as well as different treatment options both conservative and surgical.  Currently the patient states that the ankle is minimally symptomatic and tolerable.  He is able to have full quality of life/full activity with no restrictions and only minimal symptoms.  Currently opting for conservative management.  Return to clinic as needed. Felecia Shelling, DPM [facility]  Dr. Felecia Shelling, DPM   [address] [place], [place] [address]                Office [phone]  Fax [phone]

## 2022-05-11 DIAGNOSIS — D225 Melanocytic nevi of trunk: Secondary | ICD-10-CM | POA: Diagnosis not present

## 2022-05-11 DIAGNOSIS — D2261 Melanocytic nevi of right upper limb, including shoulder: Secondary | ICD-10-CM | POA: Diagnosis not present

## 2022-05-11 DIAGNOSIS — D485 Neoplasm of uncertain behavior of skin: Secondary | ICD-10-CM | POA: Diagnosis not present

## 2022-05-11 DIAGNOSIS — C4441 Basal cell carcinoma of skin of scalp and neck: Secondary | ICD-10-CM | POA: Diagnosis not present

## 2022-05-11 DIAGNOSIS — D492 Neoplasm of unspecified behavior of bone, soft tissue, and skin: Secondary | ICD-10-CM | POA: Diagnosis not present

## 2022-05-11 DIAGNOSIS — L821 Other seborrheic keratosis: Secondary | ICD-10-CM | POA: Diagnosis not present

## 2022-05-11 DIAGNOSIS — D2262 Melanocytic nevi of left upper limb, including shoulder: Secondary | ICD-10-CM | POA: Diagnosis not present

## 2022-05-15 DIAGNOSIS — J45991 Cough variant asthma: Secondary | ICD-10-CM | POA: Diagnosis not present

## 2022-05-15 DIAGNOSIS — H1045 Other chronic allergic conjunctivitis: Secondary | ICD-10-CM | POA: Diagnosis not present

## 2022-05-15 DIAGNOSIS — J3089 Other allergic rhinitis: Secondary | ICD-10-CM | POA: Diagnosis not present

## 2022-05-15 DIAGNOSIS — Z6825 Body mass index (BMI) 25.0-25.9, adult: Secondary | ICD-10-CM | POA: Diagnosis not present

## 2022-05-17 DIAGNOSIS — M5411 Radiculopathy, occipito-atlanto-axial region: Secondary | ICD-10-CM | POA: Diagnosis not present

## 2022-05-17 DIAGNOSIS — M9903 Segmental and somatic dysfunction of lumbar region: Secondary | ICD-10-CM | POA: Diagnosis not present

## 2022-05-24 DIAGNOSIS — M06072 Rheumatoid arthritis without rheumatoid factor, left ankle and foot: Secondary | ICD-10-CM | POA: Diagnosis not present

## 2022-05-25 DIAGNOSIS — C4441 Basal cell carcinoma of skin of scalp and neck: Secondary | ICD-10-CM | POA: Diagnosis not present

## 2022-08-04 DIAGNOSIS — J029 Acute pharyngitis, unspecified: Secondary | ICD-10-CM | POA: Diagnosis not present

## 2022-09-07 IMAGING — CT CT CARDIAC CORONARY ARTERY CALCIUM SCORE
3 series · 14 of 20 positions shown, 16 images · non-contrast
Comparison: Prior calcium score study on 03/02/2014

CLINICAL DATA: 40-year-old Caucasian male with family history of
heart disease.

EXAM:
CT CARDIAC CORONARY ARTERY CALCIUM SCORE
TECHNIQUE: Non-contrast imaging through the heart was performed using
prospective ECG gating. Image post processing was performed on an
independent workstation, allowing for quantitative analysis of the
heart and coronary arteries. Note that this exam targets the heart
and the chest was not imaged in its entirety.

[Series 2: calcium scoring 2.00 qr36 bestdiast 67% hrt calciu · axial · 0.39mm/px · z∈[+1662,+1740]mm · 4 of 67 slices shown]
[im 14/67  vessel]
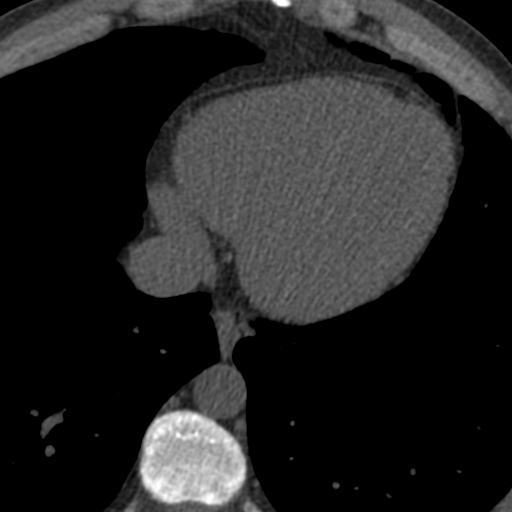
[im 27/67  vessel]
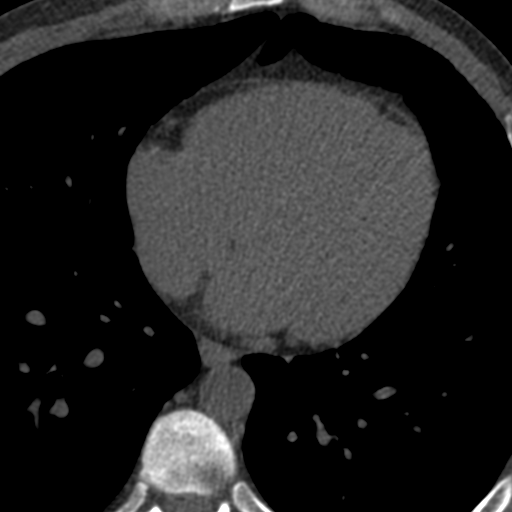
[im 40/67  vessel]
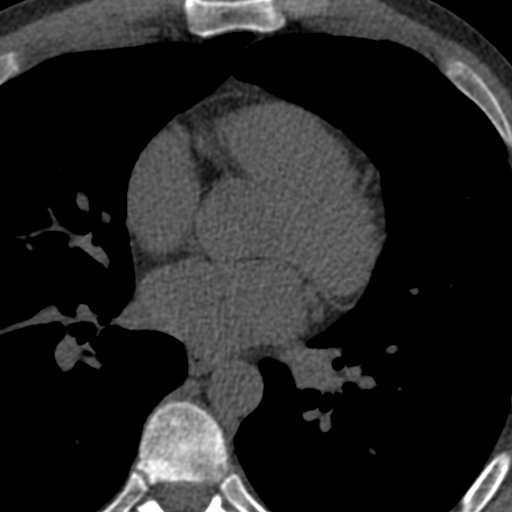
[im 53/67  vessel]
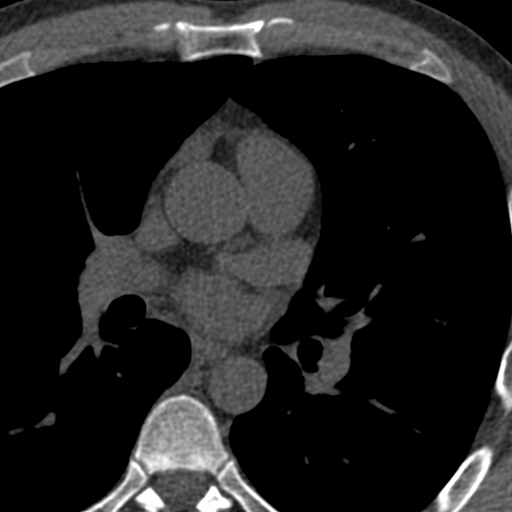

[Series 3: calcium scoring 2.00 br40 bestdiast 67% axial · axial · 0.58mm/px · z∈[+1658,+1746]mm · 5 of 67 slices shown, 7 images]
[im 12/67  vessel]
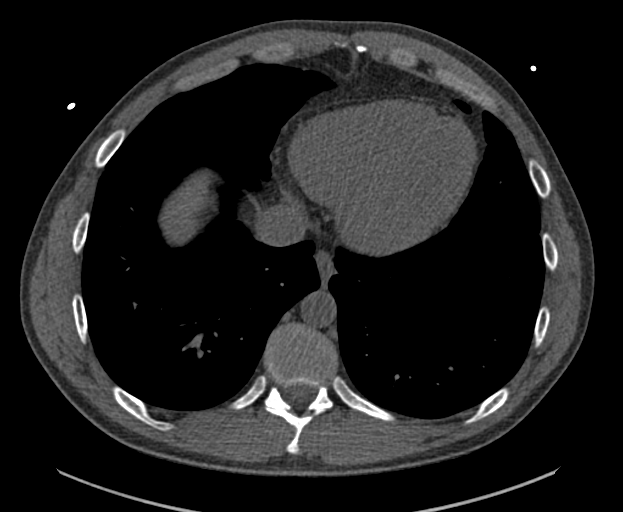
[im 12/67  lung]
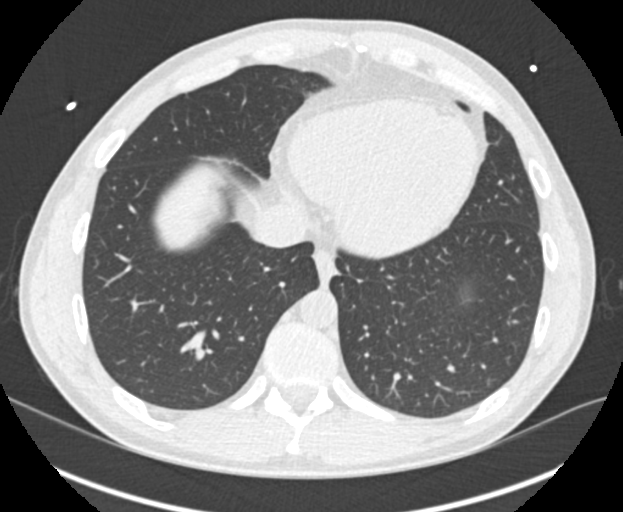
[im 23/67  vessel]
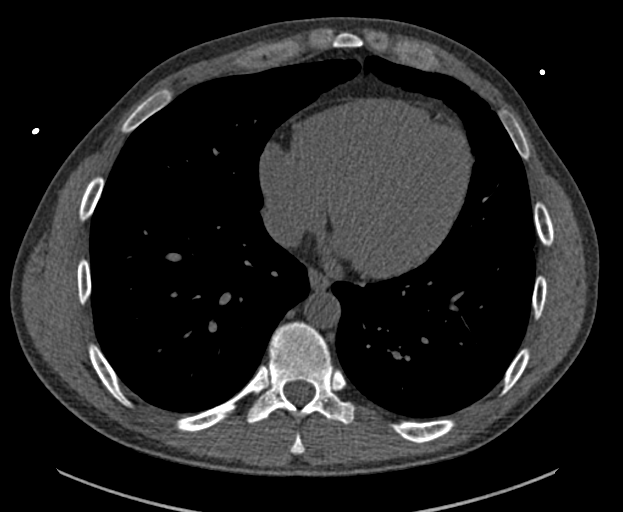
[im 34/67  vessel]
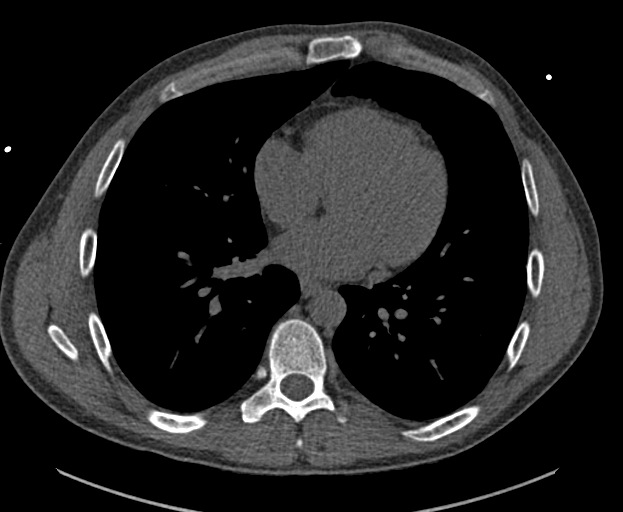
[im 45/67  vessel]
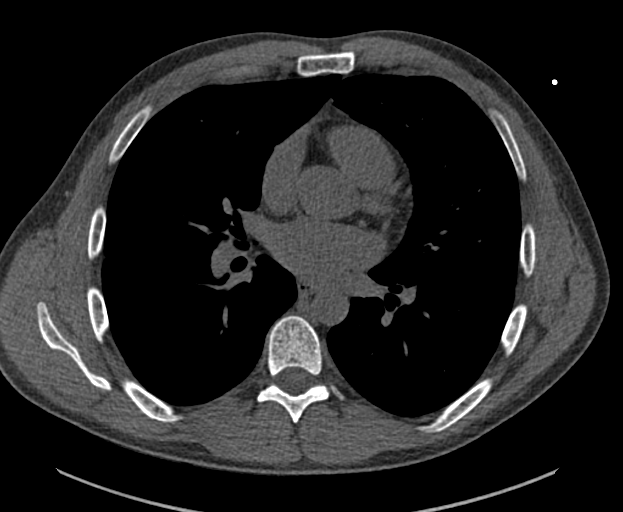
[im 56/67  vessel]
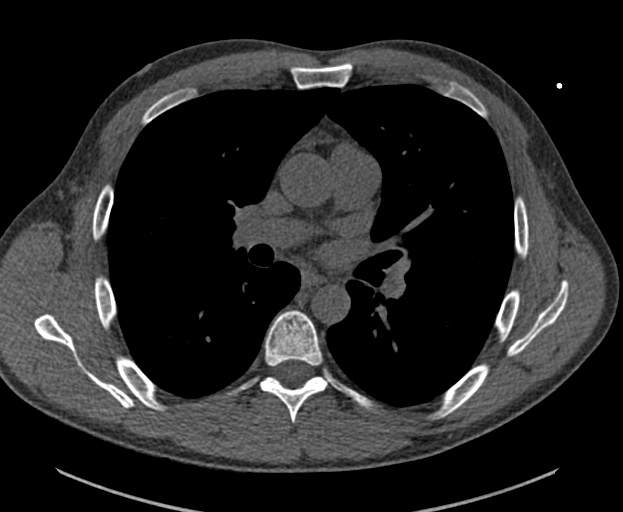
[im 56/67  lung]
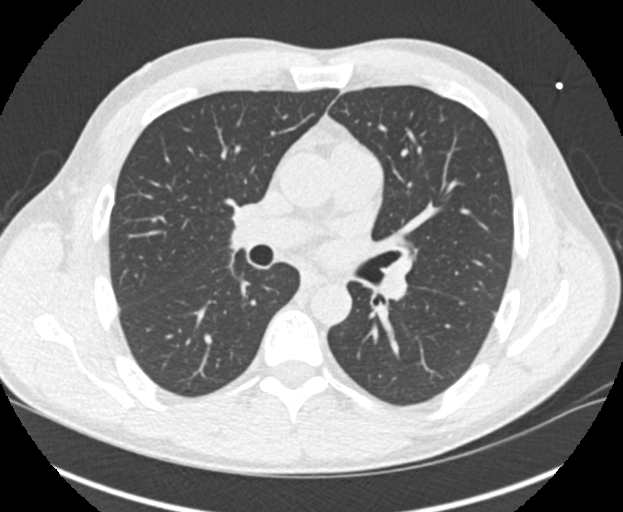

[Series 9: calcium scoring 2.00 br60 bestdiast 67% lungs · axial · 0.58mm/px · z∈[+1658,+1746]mm · 5 of 67 slices shown]
[im 12/67  vessel]
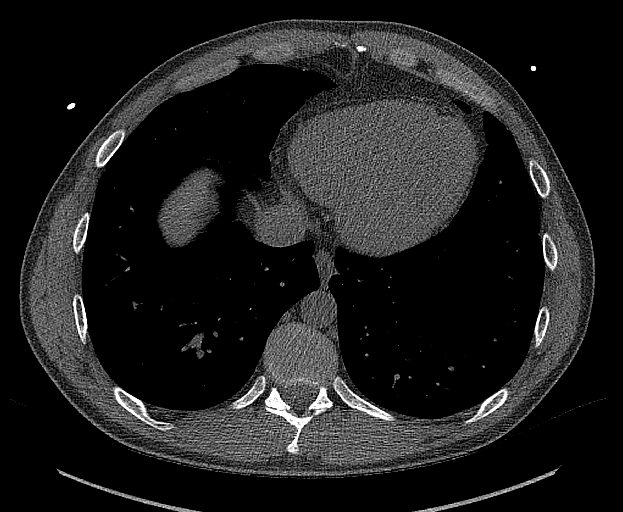
[im 23/67  vessel]
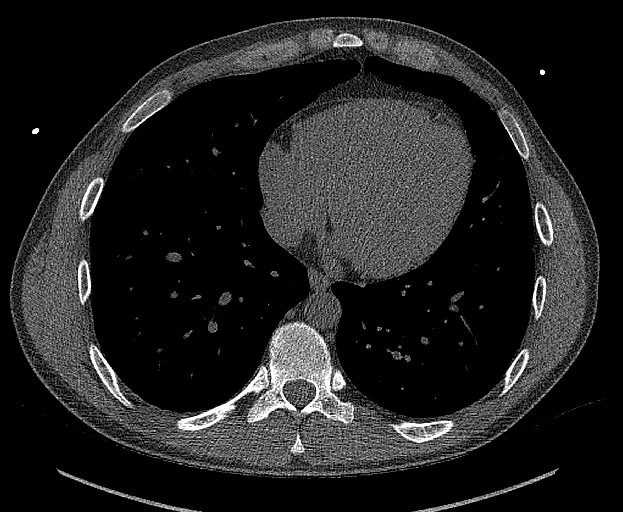
[im 34/67  vessel]
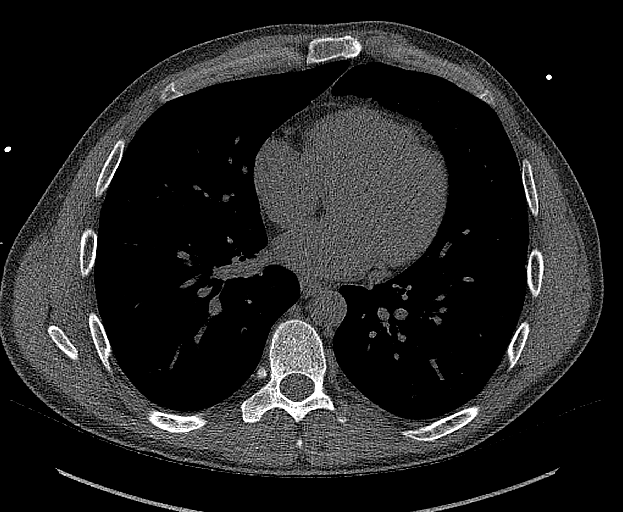
[im 45/67  vessel]
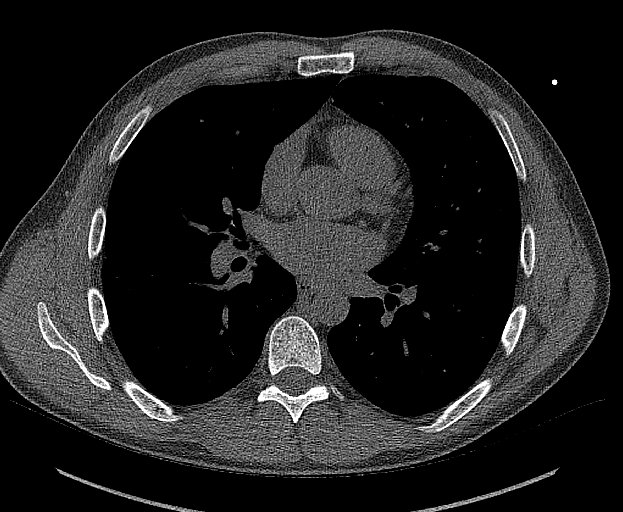
[im 56/67  vessel]
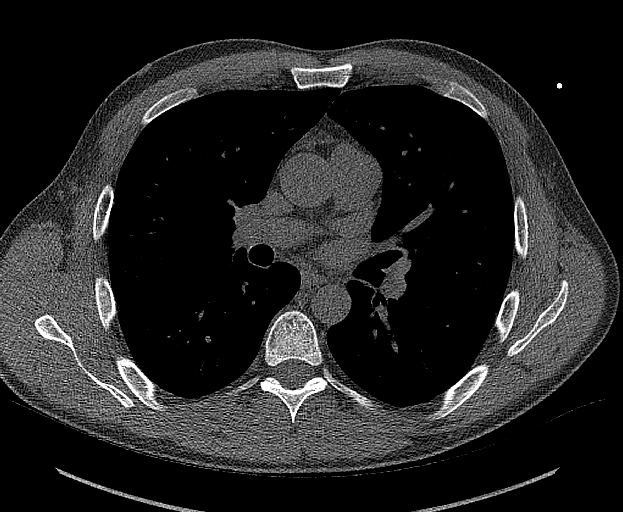

[14 of 20 positions shown; findings below may reference images not displayed]

FINDINGS: CORONARY CALCIUM SCORES:

Left Main: 0

LAD: 0

LCx: 0

RCA: 0

Total Agatston Score: 0

[HOSPITAL] percentile: 0

AORTA MEASUREMENTS:

Ascending Aorta: 30 mm

Descending Aorta: 21 mm

OTHER FINDINGS:


## 2023-01-12 ENCOUNTER — Ambulatory Visit: Payer: BC Managed Care – PPO | Admitting: Cardiology

## 2023-01-29 ENCOUNTER — Encounter: Payer: Self-pay | Admitting: Cardiology

## 2023-01-29 ENCOUNTER — Ambulatory Visit: Payer: BC Managed Care – PPO | Admitting: Cardiology

## 2023-01-29 VITALS — BP 126/79 | HR 52 | Resp 16 | Ht 69.0 in | Wt 173.0 lb

## 2023-01-29 DIAGNOSIS — Z8249 Family history of ischemic heart disease and other diseases of the circulatory system: Secondary | ICD-10-CM | POA: Diagnosis not present

## 2023-01-29 DIAGNOSIS — E782 Mixed hyperlipidemia: Secondary | ICD-10-CM | POA: Diagnosis not present

## 2023-01-29 MED ORDER — SIMVASTATIN 10 MG PO TABS: 10.0000 mg | ORAL_TABLET | Freq: Every day | ORAL | 3 refills | Status: AC

## 2023-01-29 MED ORDER — SIMVASTATIN 20 MG PO TABS: 20.0000 mg | ORAL_TABLET | Freq: Every day | ORAL | 3 refills | Status: DC

## 2023-01-29 NOTE — Progress Notes (Signed)
Patient referred by Selina Cooley, MD for family history of early coronary artery disease  Subjective:   Benjamin Ran., male    DOB: 11/30/1980, 42 y.o.   MRN: [id number]   Chief Complaint  Patient presents with   Family history of early CAD   Hyperlipidemia   Follow-up    HPI  42 y.o. Caucasian male with mixed hyperlipidemia, family history of early coronary artery disease  Patient was seen at [facility] clinic and underwent detailed testing, as well as repeat CT cardiac scoring recently.  But his calcium score remains 0, LDL remains elevated.  He has no symptoms.  Discussion regarding primary prevention as below.  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  He stays active, playing tennis couple times a week without any symptoms of chest pain or shortness of breath.  He has strong family history of coronary artery disease.  His father had MI at age 59.  He has few other family worse on paternal side also with coronary artery disease.  There is no strong family history of hyperlipidemia on his father side.  However, there is, possibly familial hypercholesterolemia history on his mother side, fortunately without any early premature coronary artery disease.  His mother did have stent placed in her 62s.  Patient reportedly underwent CT cardiac scoring scan over 5 years ago, which reportedly showed calcium score of 0.  His LDL is 159, HDL is 59.  He has been eating predominantly plant-based diet until recently when he admits to have had changes to his diet after having kids.  No current outpatient medications on file prior to visit.   No current facility-administered medications on file prior to visit.    Cardiovascular and other pertinent studies:  EKG 04/18/2021: Sinus rhythm 56 bpm Normal EKG  CT cardiac scoring 05/2021: Calcium score 0  Recent labs: 05/03/2022: Glucose 93, BUN/Cr 11/0.82. EGFR  115. Na/K 139/4.3. Rest of the CMP normal H/H 14/42. MCV 86. Platelets 200 HbA1C NA Chol 237, TG 63, HDL 57, LDL 167 TSH 0.9 normal  06/28/2020: Glucose 103, BUN/Cr 8/0.7. EGFR >90. Na/K 139/4.0. Rest of the CMP normal H/H 14/42. MCV 87. Platelets 201 HbA1C N/A Chol 224, TG 79, HDL 59, LDL 159 TSH 0.7 normal    Review of Systems  Cardiovascular:  Negative for chest pain, dyspnea on exertion, leg swelling, palpitations and syncope.         Vitals:   01/29/23 1502  BP: 126/79  Pulse: (!) 52  Resp: 16  SpO2: 95%      Objective:   Physical Exam Vitals and nursing note reviewed.  Constitutional:      General: He is not in acute distress. Neck:     Vascular: No JVD.  Cardiovascular:     Rate and Rhythm: Normal rate and regular rhythm.     Heart sounds: Normal heart sounds. No murmur heard. Pulmonary:     Effort: Pulmonary effort is normal.     Breath sounds: Normal breath sounds. No wheezing or rales.  Musculoskeletal:     Right lower leg: No edema.     Left lower leg: No edema.         Assessment & Recommendations:    43 y.o. Caucasian male with mixed hyperlipidemia, family history of early coronary artery disease  Mixed hyperlipidemia: While traditionally CV risk models support predict <7.5% 10-year risk, strong family history with MI in his father at  age 34.  He lives fairly healthy lifestyle, does not smoke, is not diabetic.  Calcium score is 0, lipoprotein is <6, his LDL remains elevated >160.  Weighing risks and benefits, I would recommend at least a low-dose of simvastatin at 10 mg daily.  I would recommend him to take it at least couple times a week.  Will repeat lipid panel in 6 months.  F/u in 6 months    Elder Negus, MD Pager: [phone] Office: [phone]

## 2023-08-01 ENCOUNTER — Ambulatory Visit: Payer: BC Managed Care – PPO | Admitting: Cardiology

## 2023-09-04 ENCOUNTER — Ambulatory Visit: Payer: BC Managed Care – PPO | Admitting: Cardiology

## 2023-10-11 ENCOUNTER — Ambulatory Visit: Attending: Cardiology | Admitting: Cardiology

## 2024-09-22 ENCOUNTER — Ambulatory Visit: Admitting: Cardiology
